# Patient Record
Sex: Female | Born: 1980
Health system: Southern US, Community
[De-identification: ages and names within clinical notes are randomized; demographics above are authoritative.]

## PROBLEM LIST (undated history)

## (undated) DIAGNOSIS — D649 Anemia, unspecified: Secondary | ICD-10-CM

## (undated) DIAGNOSIS — F32A Depression, unspecified: Secondary | ICD-10-CM

## (undated) DIAGNOSIS — F419 Anxiety disorder, unspecified: Secondary | ICD-10-CM

## (undated) DIAGNOSIS — F329 Major depressive disorder, single episode, unspecified: Secondary | ICD-10-CM

## (undated) DIAGNOSIS — Z8659 Personal history of other mental and behavioral disorders: Secondary | ICD-10-CM

## (undated) DIAGNOSIS — I10 Essential (primary) hypertension: Secondary | ICD-10-CM

## (undated) DIAGNOSIS — O159 Eclampsia, unspecified as to time period: Secondary | ICD-10-CM

## (undated) DIAGNOSIS — I959 Hypotension, unspecified: Secondary | ICD-10-CM

## (undated) DIAGNOSIS — R32 Unspecified urinary incontinence: Secondary | ICD-10-CM

## (undated) HISTORY — DX: Hypotension, unspecified: I95.9

## (undated) HISTORY — DX: Unspecified urinary incontinence: R32

## (undated) HISTORY — PX: GALLBLADDER SURGERY: SHX652

## (undated) HISTORY — PX: WISDOM TOOTH EXTRACTION: SHX21

## (undated) HISTORY — DX: Anemia, unspecified: D64.9

## (undated) HISTORY — PX: MOUTH SURGERY: SHX715

## (undated) HISTORY — DX: Personal history of other mental and behavioral disorders: Z86.59

---

## 2013-08-12 DIAGNOSIS — O159 Eclampsia, unspecified as to time period: Secondary | ICD-10-CM

## 2013-08-17 DIAGNOSIS — Z8759 Personal history of other complications of pregnancy, childbirth and the puerperium: Secondary | ICD-10-CM | POA: Insufficient documentation

## 2018-11-24 NOTE — L&D Delivery Note (Signed)
OB/GYN Faculty Practice Delivery Note  Mary Hardy is a 38 y.o. [id number] s/p SVD at [redacted]w[redacted]d. She was admitted for IOL secondary to pre-E without severe features superimposed on cHTN. Patient also with hx of A2DM.  ROM: 10h 7m with clear fluid GBS Status: pos   Maximum Maternal Temperature: 99.46F  Labor Progress: Patient presented to L&D for IOL secondary to pre-E without severe features superimposed on cHTN. Patient also with hx of A2DM. Initial SVE: 2/thick/-3. She received Cytotec and Foley. She received an Epidural. Patient received adequate GBS ppx with PCN. She then progressed to complete.   Delivery Date/Time: 8/22 at 1126 Delivery: Called to room and patient was complete and pushing. Head delivered in ROA position. No nuchal cord present. Shoulder and body delivered in usual fashion. Infant with spontaneous cry, placed on mother's abdomen, dried and stimulated. Cord clamped x 2 after 1-minute delay, and cut by provider. Cord blood drawn. Placenta delivered spontaneously with gentle cord traction. Fundus firm with massage and Pitocin. Labia, perineum, vagina, and cervix inspected inspected with no lacerations.  Baby Weight: 3121 g  Placenta: sent to L&D Complications: None Lacerations: None EBL: 400 mL Analgesia: Epidural    Postpartum Planning [ ]  message to sent to schedule follow-up  [ ]  vaccines UTD  Infant: APGAR (1 MIN): 8  APGAR (5 MINS):  9 APGAR (10 MINS):     Barrington Ellison, MD [facility] Family Medicine Fellow, [facility], [place] [facility] 07/16/2019, 1:37 PM

## 2018-12-08 ENCOUNTER — Encounter: Payer: BLUE CROSS/BLUE SHIELD | Admitting: Adult Health

## 2018-12-09 ENCOUNTER — Encounter: Payer: Self-pay | Admitting: Adult Health

## 2018-12-09 ENCOUNTER — Ambulatory Visit: Payer: BLUE CROSS/BLUE SHIELD | Admitting: Adult Health

## 2018-12-09 VITALS — BP 123/93 | HR 97 | Ht 62.0 in | Wt 190.5 lb

## 2018-12-09 DIAGNOSIS — Z3201 Encounter for pregnancy test, result positive: Secondary | ICD-10-CM | POA: Diagnosis not present

## 2018-12-09 DIAGNOSIS — O09521 Supervision of elderly multigravida, first trimester: Secondary | ICD-10-CM

## 2018-12-09 DIAGNOSIS — O09211 Supervision of pregnancy with history of pre-term labor, first trimester: Secondary | ICD-10-CM

## 2018-12-09 DIAGNOSIS — R110 Nausea: Secondary | ICD-10-CM

## 2018-12-09 DIAGNOSIS — N926 Irregular menstruation, unspecified: Secondary | ICD-10-CM

## 2018-12-09 DIAGNOSIS — O3680X0 Pregnancy with inconclusive fetal viability, not applicable or unspecified: Secondary | ICD-10-CM | POA: Insufficient documentation

## 2018-12-09 DIAGNOSIS — Z3490 Encounter for supervision of normal pregnancy, unspecified, unspecified trimester: Secondary | ICD-10-CM

## 2018-12-09 DIAGNOSIS — Z8751 Personal history of pre-term labor: Secondary | ICD-10-CM | POA: Insufficient documentation

## 2018-12-09 DIAGNOSIS — O09891 Supervision of other high risk pregnancies, first trimester: Secondary | ICD-10-CM

## 2018-12-09 LAB — POCT URINE PREGNANCY: Preg Test, Ur: POSITIVE — AB

## 2018-12-09 MED ORDER — PRENATAL PLUS 27-1 MG PO TABS: 1.0000 | ORAL_TABLET | Freq: Every day | ORAL | 12 refills | Status: DC

## 2018-12-09 MED ORDER — ESCITALOPRAM OXALATE 10 MG PO TABS: 10.0000 mg | ORAL_TABLET | Freq: Every day | ORAL | 6 refills | Status: DC

## 2018-12-09 NOTE — Patient Instructions (Signed)
First Trimester of Pregnancy  The first trimester of pregnancy is from week 1 until the end of week 13 (months 1 through 3). A week after a sperm fertilizes an egg, the egg will implant on the wall of the uterus. This embryo will begin to develop into a baby. Genes from you and your partner will form the baby. The female genes will determine whether the baby will be a boy or a girl. At 6-8 weeks, the eyes and face will be formed, and the heartbeat can be seen on ultrasound. At the end of 12 weeks, all the baby's organs will be formed.  Now that you are pregnant, you will want to do everything you can to have a healthy baby. Two of the most important things are to get good prenatal care and to follow your health care provider's instructions. Prenatal care is all the medical care you receive before the baby's birth. This care will help prevent, find, and treat any problems during the pregnancy and childbirth.  Body changes during your first trimester  Your body goes through many changes during pregnancy. The changes vary from woman to woman.   You may gain or lose a couple of pounds at first.   You may feel sick to your stomach (nauseous) and you may throw up (vomit). If the vomiting is uncontrollable, call your health care provider.   You may tire easily.   You may develop headaches that can be relieved by medicines. All medicines should be approved by your health care provider.   You may urinate more often. Painful urination may mean you have a bladder infection.   You may develop heartburn as a result of your pregnancy.   You may develop constipation because certain hormones are causing the muscles that push stool through your intestines to slow down.   You may develop hemorrhoids or swollen veins (varicose veins).   Your breasts may begin to grow larger and become tender. Your nipples may stick out more, and the tissue that surrounds them (areola) may become darker.   Your gums may bleed and may be  sensitive to brushing and flossing.   Dark spots or blotches (chloasma, mask of pregnancy) may develop on your face. This will likely fade after the baby is born.   Your menstrual periods will stop.   You may have a loss of appetite.   You may develop cravings for certain kinds of food.   You may have changes in your emotions from day to day, such as being excited to be pregnant or being concerned that something may go wrong with the pregnancy and baby.   You may have more vivid and strange dreams.   You may have changes in your hair. These can include thickening of your hair, rapid growth, and changes in texture. Some women also have hair loss during or after pregnancy, or hair that feels dry or thin. Your hair will most likely return to normal after your baby is born.  What to expect at prenatal visits  During a routine prenatal visit:   You will be weighed to make sure you and the baby are growing normally.   Your blood pressure will be taken.   Your abdomen will be measured to track your baby's growth.   The fetal heartbeat will be listened to between weeks 10 and 14 of your pregnancy.   Test results from any previous visits will be discussed.  Your health care provider may ask you:     How you are feeling.   If you are feeling the baby move.   If you have had any abnormal symptoms, such as leaking fluid, bleeding, severe headaches, or abdominal cramping.   If you are using any tobacco products, including cigarettes, chewing tobacco, and electronic cigarettes.   If you have any questions.  Other tests that may be performed during your first trimester include:   Blood tests to find your blood type and to check for the presence of any previous infections. The tests will also be used to check for low iron levels (anemia) and protein on red blood cells (Rh antibodies). Depending on your risk factors, or if you previously had diabetes during pregnancy, you may have tests to check for high blood sugar  that affects pregnant women (gestational diabetes).   Urine tests to check for infections, diabetes, or protein in the urine.   An ultrasound to confirm the proper growth and development of the baby.   Fetal screens for spinal cord problems (spina bifida) and Down syndrome.   HIV (human immunodeficiency virus) testing. Routine prenatal testing includes screening for HIV, unless you choose not to have this test.   You may need other tests to make sure you and the baby are doing well.  Follow these instructions at home:  Medicines   Follow your health care provider's instructions regarding medicine use. Specific medicines may be either safe or unsafe to take during pregnancy.   Take a prenatal vitamin that contains at least 600 micrograms (mcg) of folic acid.   If you develop constipation, try taking a stool softener if your health care provider approves.  Eating and drinking     Eat a balanced diet that includes fresh fruits and vegetables, whole grains, good sources of protein such as meat, eggs, or tofu, and low-fat dairy. Your health care provider will help you determine the amount of weight gain that is right for you.   Avoid raw meat and uncooked cheese. These carry germs that can cause birth defects in the baby.   Eating four or five small meals rather than three large meals a day may help relieve nausea and vomiting. If you start to feel nauseous, eating a few soda crackers can be helpful. Drinking liquids between meals, instead of during meals, also seems to help ease nausea and vomiting.   Limit foods that are high in fat and processed sugars, such as fried and sweet foods.   To prevent constipation:  ? Eat foods that are high in fiber, such as fresh fruits and vegetables, whole grains, and beans.  ? Drink enough fluid to keep your urine clear or pale yellow.  Activity   Exercise only as directed by your health care provider. Most women can continue their usual exercise routine during  pregnancy. Try to exercise for 30 minutes at least 5 days a week. Exercising will help you:  ? Control your weight.  ? Stay in shape.  ? Be prepared for labor and delivery.   Experiencing pain or cramping in the lower abdomen or lower back is a good sign that you should stop exercising. Check with your health care provider before continuing with normal exercises.   Try to avoid standing for long periods of time. Move your legs often if you must stand in one place for a long time.   Avoid heavy lifting.   Wear low-heeled shoes and practice good posture.   You may continue to have sex unless your health care   provider tells you not to.  Relieving pain and discomfort   Wear a good support bra to relieve breast tenderness.   Take warm sitz baths to soothe any pain or discomfort caused by hemorrhoids. Use hemorrhoid cream if your health care provider approves.   Rest with your legs elevated if you have leg cramps or low back pain.   If you develop varicose veins in your legs, wear support hose. Elevate your feet for 15 minutes, 3-4 times a day. Limit salt in your diet.  Prenatal care   Schedule your prenatal visits by the twelfth week of pregnancy. They are usually scheduled monthly at first, then more often in the last 2 months before delivery.   Write down your questions. Take them to your prenatal visits.   Keep all your prenatal visits as told by your health care provider. This is important.  Safety   Wear your seat belt at all times when driving.   Make a list of emergency phone numbers, including numbers for family, friends, the hospital, and police and fire departments.  General instructions   Ask your health care provider for a referral to a local prenatal education class. Begin classes no later than the beginning of month 6 of your pregnancy.   Ask for help if you have counseling or nutritional needs during pregnancy. Your health care provider can offer advice or refer you to specialists for help  with various needs.   Do not use hot tubs, steam rooms, or saunas.   Do not douche or use tampons or scented sanitary pads.   Do not cross your legs for long periods of time.   Avoid cat litter boxes and soil used by cats. These carry germs that can cause birth defects in the baby and possibly loss of the fetus by miscarriage or stillbirth.   Avoid all smoking, herbs, alcohol, and medicines not prescribed by your health care provider. Chemicals in these products affect the formation and growth of the baby.   Do not use any products that contain nicotine or tobacco, such as cigarettes and e-cigarettes. If you need help quitting, ask your health care provider. You may receive counseling support and other resources to help you quit.   Schedule a dentist appointment. At home, brush your teeth with a soft toothbrush and be gentle when you floss.  Contact a health care provider if:   You have dizziness.   You have mild pelvic cramps, pelvic pressure, or nagging pain in the abdominal area.   You have persistent nausea, vomiting, or diarrhea.   You have a bad smelling vaginal discharge.   You have pain when you urinate.   You notice increased swelling in your face, hands, legs, or ankles.   You are exposed to fifth disease or chickenpox.   You are exposed to German measles (rubella) and have never had it.  Get help right away if:   You have a fever.   You are leaking fluid from your vagina.   You have spotting or bleeding from your vagina.   You have severe abdominal cramping or pain.   You have rapid weight gain or loss.   You vomit blood or material that looks like coffee grounds.   You develop a severe headache.   You have shortness of breath.   You have any kind of trauma, such as from a fall or a car accident.  Summary   The first trimester of pregnancy is from week 1 until   the end of week 13 (months 1 through 3).   Your body goes through many changes during pregnancy. The changes vary from  woman to woman.   You will have routine prenatal visits. During those visits, your health care provider will examine you, discuss any test results you may have, and talk with you about how you are feeling.  This information is not intended to replace advice given to you by your health care provider. Make sure you discuss any questions you have with your health care provider.  Document Released: 11/04/2001 Document Revised: 10/22/2016 Document Reviewed: 10/22/2016  Elsevier Interactive Patient Education  2019 [employer]

## 2018-12-09 NOTE — Progress Notes (Signed)
Patient ID: Mary Hardy, female   DOB: 02-May-1981, 38 y.o.   MRN: [id number] History of Present Illness: Shawnte is a 38 year old white female, married, in for UPT, has missed periods, but had ben on OCs till 2 weeks before Thanksgiving. She has 25 year old daughter, that was delivered at 1 + weeks.   Current Medications, Allergies, Past Medical History, Past Surgical History, Family History and Social History were reviewed in [employer] record.     Review of Systems: +missed periods, with 5 +HPTs +nausea  +UI, ?due to nerve damage at delivery she says     Physical Exam:BP (!) 123/93 (BP Location: Left Arm, Patient Position: Sitting, Cuff Size: Normal)   Pulse 97   Ht 5\' 2"  (1.575 m)   Wt 190 lb 8 oz (86.4 kg)   LMP 08/03/2018 (Approximate)   BMI 34.84 kg/m   UPT+, 18+2 weeks by LMP with EDD 05/11/2019 General:  Well developed, well nourished, no acute distress Skin:  Warm and dry Neck:  Midline trachea, normal thyroid, good ROM, no lymphadenopathy Lungs; Clear to auscultation bilaterally Cardiovascular: Regular rate and rhythm Abdomen:  Soft, non tender,could not hear FHT with doppler.but not 18 weeks by size Psych:  No mood changes, alert and cooperative,seems happy Fall risk is low.  PHQ 9 score is 16, denies being suicidal, had postpartum depression(did not take meds then), will rx lexapro. Face time 20 minutes.  Impression: 1. Pregnancy examination or test, positive result   2. Pregnancy, unspecified gestational age   36. Encounter to determine fetal viability of pregnancy, single or unspecified fetus   4. Multigravida of advanced maternal age in first trimester   5. History of preterm delivery, currently pregnant in first trimester       Plan: Meds ordered this encounter  Medications  . prenatal vitamin w/FE, FA (PRENATAL 1 + 1) 27-1 MG TABS tablet    Sig: Take 1 tablet by mouth daily at 12 noon.    Dispense:  30 each    Refill:  12   Order Specific Question:   Supervising Provider    Answer:   Despina Hidden, LUTHER H [[id number]]  . escitalopram (LEXAPRO) 10 MG tablet    Sig: Take 1 tablet (10 mg total) by mouth daily.    Dispense:  30 tablet    Refill:  6    Order Specific Question:   Supervising Provider    Answer:   Lazaro Arms [[id number]]  Eat often Request records from [facility] care delivery and from doctor see saw about UI Return in 1 week for dating US/ 2 weeks for new OB Review handouts on First trimester and by [facility]

## 2018-12-17 ENCOUNTER — Ambulatory Visit (INDEPENDENT_AMBULATORY_CARE_PROVIDER_SITE_OTHER): Payer: BLUE CROSS/BLUE SHIELD

## 2018-12-17 DIAGNOSIS — O3680X0 Pregnancy with inconclusive fetal viability, not applicable or unspecified: Secondary | ICD-10-CM | POA: Diagnosis not present

## 2018-12-17 NOTE — Progress Notes (Signed)
[place] 7+3 wks,single IUP w/ys,positive fht 153 bpm,normal left ovary,simple right ovarian cyst 4.6 x 3.5 x 3.4 cm,crl 12.22 mm

## 2018-12-28 ENCOUNTER — Encounter: Payer: Self-pay | Admitting: *Deleted

## 2018-12-29 ENCOUNTER — Telehealth: Payer: Self-pay | Admitting: Women's Health

## 2018-12-29 NOTE — Telephone Encounter (Signed)
Pt husband called back and said his wife if feeling better and does not need a nurse to call

## 2018-12-29 NOTE — Telephone Encounter (Signed)
PT'S HUSBAND CALLED AND NEEDS TO TALK TO A NURSE ABOUT PAIN SHE IS HAVING. IS ONLY 10 WEEKS. PT DOES NOT HAVE ANY BLEEDING

## 2019-01-03 ENCOUNTER — Other Ambulatory Visit: Payer: Self-pay

## 2019-01-03 ENCOUNTER — Other Ambulatory Visit (HOSPITAL_COMMUNITY)
Admission: RE | Admit: 2019-01-03 | Discharge: 2019-01-03 | Disposition: A | Discharge status: Home / Self Care | Payer: BLUE CROSS/BLUE SHIELD | Source: Ambulatory Visit | Attending: Obstetrics & Gynecology | Admitting: Obstetrics & Gynecology

## 2019-01-03 ENCOUNTER — Ambulatory Visit: Payer: BLUE CROSS/BLUE SHIELD | Admitting: *Deleted

## 2019-01-03 ENCOUNTER — Ambulatory Visit (INDEPENDENT_AMBULATORY_CARE_PROVIDER_SITE_OTHER): Payer: BLUE CROSS/BLUE SHIELD | Admitting: Women's Health

## 2019-01-03 ENCOUNTER — Encounter: Payer: Self-pay | Admitting: Women's Health

## 2019-01-03 VITALS — BP 134/91 | HR 93 | Wt 194.0 lb

## 2019-01-03 DIAGNOSIS — O09291 Supervision of pregnancy with other poor reproductive or obstetric history, first trimester: Secondary | ICD-10-CM | POA: Diagnosis not present

## 2019-01-03 DIAGNOSIS — Z3482 Encounter for supervision of other normal pregnancy, second trimester: Secondary | ICD-10-CM | POA: Diagnosis not present

## 2019-01-03 DIAGNOSIS — Z3A09 9 weeks gestation of pregnancy: Secondary | ICD-10-CM | POA: Diagnosis not present

## 2019-01-03 DIAGNOSIS — Z124 Encounter for screening for malignant neoplasm of cervix: Secondary | ICD-10-CM | POA: Insufficient documentation

## 2019-01-03 DIAGNOSIS — O09299 Supervision of pregnancy with other poor reproductive or obstetric history, unspecified trimester: Secondary | ICD-10-CM | POA: Insufficient documentation

## 2019-01-03 DIAGNOSIS — F418 Other specified anxiety disorders: Secondary | ICD-10-CM

## 2019-01-03 DIAGNOSIS — Z23 Encounter for immunization: Secondary | ICD-10-CM | POA: Diagnosis not present

## 2019-01-03 DIAGNOSIS — Z3682 Encounter for antenatal screening for nuchal translucency: Secondary | ICD-10-CM

## 2019-01-03 DIAGNOSIS — Z8759 Personal history of other complications of pregnancy, childbirth and the puerperium: Secondary | ICD-10-CM

## 2019-01-03 DIAGNOSIS — N393 Stress incontinence (female) (male): Secondary | ICD-10-CM

## 2019-01-03 DIAGNOSIS — Z1389 Encounter for screening for other disorder: Secondary | ICD-10-CM

## 2019-01-03 DIAGNOSIS — Z8751 Personal history of pre-term labor: Secondary | ICD-10-CM

## 2019-01-03 DIAGNOSIS — O0990 Supervision of high risk pregnancy, unspecified, unspecified trimester: Secondary | ICD-10-CM | POA: Insufficient documentation

## 2019-01-03 DIAGNOSIS — Z331 Pregnant state, incidental: Secondary | ICD-10-CM

## 2019-01-03 MED ORDER — CITRANATAL ASSURE 35-1 & 300 MG PO MISC: ORAL | 11 refills | Status: DC

## 2019-01-03 NOTE — Patient Instructions (Addendum)
Mary Hardy, I greatly value your feedback.  If you receive a survey following your visit with [place] today, we appreciate you taking the time to fill it out.  Thanks, Joellyn Haff, CNM, WHNP-BC  Begin taking 162mg  (two 81mg  tablets) baby aspirin daily at 12 weeks of pregnancy (2/25)  to decrease risk of preeclampsia during pregnancy    Check your blood pressure 4 times daily and keep a log, bring this with you to all appointments.  If blood pressure is >=160 on top or >=110 on bottom, check again in 5 minutes, if still this high, call us    Nausea & Vomiting  Have saltine crackers or pretzels by your bed and eat a few bites before you raise your head out of bed in the morning  Eat small frequent meals throughout the day instead of large meals  Drink plenty of fluids throughout the day to stay hydrated, just don't drink a lot of fluids with your meals.  This can make your stomach fill up faster making you feel sick  Do not brush your teeth right after you eat  Products with real ginger are good for nausea, like ginger ale and ginger hard candy Make sure it says made with real ginger!  Sucking on sour candy like lemon heads is also good for nausea  If your prenatal vitamins make you nauseated, take them at night so you will sleep through the nausea  Sea Bands  If you feel like you need medicine for the nausea & vomiting please let us know  If you are unable to keep any fluids or food down please let us know   Constipation  Drink plenty of fluid, preferably water, throughout the day  Eat foods high in fiber such as fruits, vegetables, and grains  Exercise, such as walking, is a good way to keep your bowels regular  Drink warm fluids, especially warm prune juice, or decaf coffee  Eat a 1/2 cup of real oatmeal (not instant), 1/2 cup applesauce, and 1/2-1 cup warm prune juice every day  If needed, you may take Colace (docusate sodium) stool softener once or twice a day to help keep  the stool soft. If you are pregnant, wait until you are out of your first trimester (12-14 weeks of pregnancy)  If you still are having problems with constipation, you may take Miralax once daily as needed to help keep your bowels regular.  If you are pregnant, wait until you are out of your first trimester (12-14 weeks of pregnancy)   First Trimester of Pregnancy The first trimester of pregnancy is from week 1 until the end of week 12 (months 1 through 3). A week after a sperm fertilizes an egg, the egg will implant on the wall of the uterus. This embryo will begin to develop into a baby. Genes from you and your partner are forming the baby. The female genes determine whether the baby is a boy or a girl. At 6-8 weeks, the eyes and face are formed, and the heartbeat can be seen on ultrasound. At the end of 12 weeks, all the baby's organs are formed.  Now that you are pregnant, you will want to do everything you can to have a healthy baby. Two of the most important things are to get good prenatal care and to follow your health care provider's instructions. Prenatal care is all the medical care you receive before the baby's birth. This care will help prevent, find, and treat any problems  during the pregnancy and childbirth. BODY CHANGES Your body goes through many changes during pregnancy. The changes vary from woman to woman.   You may gain or lose a couple of pounds at first.  You may feel sick to your stomach (nauseous) and throw up (vomit). If the vomiting is uncontrollable, call your health care provider.  You may tire easily.  You may develop headaches that can be relieved by medicines approved by your health care provider.  You may urinate more often. Painful urination may mean you have a bladder infection.  You may develop heartburn as a result of your pregnancy.  You may develop constipation because certain hormones are causing the muscles that push waste through your intestines to slow  down.  You may develop hemorrhoids or swollen, bulging veins (varicose veins).  Your breasts may begin to grow larger and become tender. Your nipples may stick out more, and the tissue that surrounds them (areola) may become darker.  Your gums may bleed and may be sensitive to brushing and flossing.  Dark spots or blotches (chloasma, mask of pregnancy) may develop on your face. This will likely fade after the baby is born.  Your menstrual periods will stop.  You may have a loss of appetite.  You may develop cravings for certain kinds of food.  You may have changes in your emotions from day to day, such as being excited to be pregnant or being concerned that something may go wrong with the pregnancy and baby.  You may have more vivid and strange dreams.  You may have changes in your hair. These can include thickening of your hair, rapid growth, and changes in texture. Some women also have hair loss during or after pregnancy, or hair that feels dry or thin. Your hair will most likely return to normal after your baby is born. WHAT TO EXPECT AT YOUR PRENATAL VISITS During a routine prenatal visit:  You will be weighed to make sure you and the baby are growing normally.  Your blood pressure will be taken.  Your abdomen will be measured to track your baby's growth.  The fetal heartbeat will be listened to starting around week 10 or 12 of your pregnancy.  Test results from any previous visits will be discussed. Your health care provider may ask you:  How you are feeling.  If you are feeling the baby move.  If you have had any abnormal symptoms, such as leaking fluid, bleeding, severe headaches, or abdominal cramping.  If you have any questions. Other tests that may be performed during your first trimester include:  Blood tests to find your blood type and to check for the presence of any previous infections. They will also be used to check for low iron levels (anemia) and Rh  antibodies. Later in the pregnancy, blood tests for diabetes will be done along with other tests if problems develop.  Urine tests to check for infections, diabetes, or protein in the urine.  An ultrasound to confirm the proper growth and development of the baby.  An amniocentesis to check for possible genetic problems.  Fetal screens for spina bifida and Down syndrome.  You may need other tests to make sure you and the baby are doing well. HOME CARE INSTRUCTIONS  Medicines  Follow your health care provider's instructions regarding medicine use. Specific medicines may be either safe or unsafe to take during pregnancy.  Take your prenatal vitamins as directed.  If you develop constipation, try taking a stool  softener if your health care provider approves. Diet  Eat regular, well-balanced meals. Choose a variety of foods, such as meat or vegetable-based protein, fish, milk and low-fat dairy products, vegetables, fruits, and whole grain breads and cereals. Your health care provider will help you determine the amount of weight gain that is right for you.  Avoid raw meat and uncooked cheese. These carry germs that can cause birth defects in the baby.  Eating four or five small meals rather than three large meals a day may help relieve nausea and vomiting. If you start to feel nauseous, eating a few soda crackers can be helpful. Drinking liquids between meals instead of during meals also seems to help nausea and vomiting.  If you develop constipation, eat more high-fiber foods, such as fresh vegetables or fruit and whole grains. Drink enough fluids to keep your urine clear or pale yellow. Activity and Exercise  Exercise only as directed by your health care provider. Exercising will help you:  Control your weight.  Stay in shape.  Be prepared for labor and delivery.  Experiencing pain or cramping in the lower abdomen or low back is a good sign that you should stop exercising. Check  with your health care provider before continuing normal exercises.  Try to avoid standing for long periods of time. Move your legs often if you must stand in one place for a long time.  Avoid heavy lifting.  Wear low-heeled shoes, and practice good posture.  You may continue to have sex unless your health care provider directs you otherwise. Relief of Pain or Discomfort  Wear a good support bra for breast tenderness.   Take warm sitz baths to soothe any pain or discomfort caused by hemorrhoids. Use hemorrhoid cream if your health care provider approves.   Rest with your legs elevated if you have leg cramps or low back pain.  If you develop varicose veins in your legs, wear support hose. Elevate your feet for 15 minutes, 3-4 times a day. Limit salt in your diet. Prenatal Care  Schedule your prenatal visits by the twelfth week of pregnancy. They are usually scheduled monthly at first, then more often in the last 2 months before delivery.  Write down your questions. Take them to your prenatal visits.  Keep all your prenatal visits as directed by your health care provider. Safety  Wear your seat belt at all times when driving.  Make a list of emergency phone numbers, including numbers for family, friends, the hospital, and police and fire departments. General Tips  Ask your health care provider for a referral to a local prenatal education class. Begin classes no later than at the beginning of month 6 of your pregnancy.  Ask for help if you have counseling or nutritional needs during pregnancy. Your health care provider can offer advice or refer you to specialists for help with various needs.  Do not use hot tubs, steam rooms, or saunas.  Do not douche or use tampons or scented sanitary pads.  Do not cross your legs for long periods of time.  Avoid cat litter boxes and soil used by cats. These carry germs that can cause birth defects in the baby and possibly loss of the fetus  by miscarriage or stillbirth.  Avoid all smoking, herbs, alcohol, and medicines not prescribed by your health care provider. Chemicals in these affect the formation and growth of the baby.  Schedule a dentist appointment. At home, brush your teeth with a soft toothbrush and  be gentle when you floss. SEEK MEDICAL CARE IF:   You have dizziness.  You have mild pelvic cramps, pelvic pressure, or nagging pain in the abdominal area.  You have persistent nausea, vomiting, or diarrhea.  You have a bad smelling vaginal discharge.  You have pain with urination.  You notice increased swelling in your face, hands, legs, or ankles. SEEK IMMEDIATE MEDICAL CARE IF:   You have a fever.  You are leaking fluid from your vagina.  You have spotting or bleeding from your vagina.  You have severe abdominal cramping or pain.  You have rapid weight gain or loss.  You vomit blood or material that looks like coffee grounds.  You are exposed to [place] measles and have never had them.  You are exposed to fifth disease or chickenpox.  You develop a severe headache.  You have shortness of breath.  You have any kind of trauma, such as from a fall or a car accident. Document Released: 11/04/2001 Document Revised: 03/27/2014 Document Reviewed: 09/20/2013 [facility] Patient Information 2015 [employer], [place]. This information is not intended to replace advice given to you by your health care provider. Make sure you discuss any questions you have with your health care provider.

## 2019-01-03 NOTE — Progress Notes (Signed)
INITIAL OBSTETRICAL VISIT Patient name: Mary Hardy MRN [id number]  Date of birth: [birth year] Chief Complaint:   Initial Prenatal Visit (anxeity from last pregnancy, needs PNV script)  History of Present Illness:   Mary Hardy is a 38 y.o. [phone] Caucasian female at [redacted]w[redacted]d by 7wk u/s, with an Estimated Date of Delivery: 08/02/19 being seen today for her initial obstetrical visit.   Her obstetrical history is significant for SAB x 2, h/o 34wk PTB d/t eclampsia, then readmitted 4d pp for severe pre-e for 4 days.  Was on bp meds x 42yr, then stopped. NO h/o HTN since. 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 Birth was non-operative. Dep/anx- on Lexapro.  Today she reports no complaints.  Patient's last menstrual period was 08/03/2018 (approximate). Last pap >6yrs ago. Results were: normal Review of Systems:   Pertinent items are noted in HPI Denies cramping/contractions, leakage of fluid, vaginal bleeding, abnormal vaginal discharge w/ itching/odor/irritation, headaches, visual changes, shortness of breath, chest pain, abdominal pain, severe nausea/vomiting, or problems with urination or bowel movements unless otherwise stated above.  Pertinent History Reviewed:  Reviewed past medical,surgical, social, obstetrical and family history.  Reviewed problem list, medications and allergies. OB History  Gravida Para Term Preterm AB Living  4 1   1 2 1   SAB TAB Ectopic Multiple Live Births  2       1    # Outcome Date GA Lbr Len/2nd Weight Sex Delivery Anes PTL Lv  4 Current           3 SAB 2017          2 Preterm 08/12/13 [redacted]w[redacted]d  4 lb 15.7 oz (2.26 kg) F Vag-Spont EPI  LIV     Complications: Eclampsia  1 SAB            Physical Assessment:   Vitals:   01/03/19 1359 01/03/19 1419  BP: (!) 139/97 (!) 134/91  Pulse: 93   Weight: 194 lb (88 kg)   Body mass index is 35.48 kg/m.       Physical  Examination:  General appearance - well appearing, and in no distress  Mental status - alert, oriented to person, place, and time  Psych:  She has a normal mood and affect  Skin - warm and dry, normal color, no suspicious lesions noted  Chest - effort normal, all lung fields clear to auscultation bilaterally  Heart - normal rate and regular rhythm  Abdomen - soft, nontender  Extremities:  No swelling or varicosities noted  Pelvic - VULVA: normal appearing vulva with no masses, tenderness or lesions  VAGINA: normal appearing vagina with normal color and discharge, no lesions  CERVIX: normal appearing cervix without discharge or lesions, no CMT  Thin prep pap is done w/ HR HPV cotesting  Fetal Heart Rate (bpm): +u/s via informal transabdominal u/s, +active fetus   Assessment & Plan:  1) Low-Risk Pregnancy [id number] at [redacted]w[redacted]d with an Estimated Date of Delivery: 08/02/19   2) Initial OB visit  3) H/O eclampsia and pp pre-e> baseline labs today, ASA 162mg  @ 12wks  4) Elevated bp> denies h/o HTN outside of pregnancies, check bp QID at home, bring to appt/send mychart message  5) Dep/anx> continue Lexapro  6) 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 Prenat w/o [id number] (CITRANATAL ASSURE) 35-1 &  300 MG tablet    Sig: One tablet and one capsule daily    Dispense:  60 tablet    Refill:  11    Order Specific Question:   Supervising Provider    Answer:   Duane LopeEURE, LUTHER H [[id number]]    Initial labs obtained Continue prenatal vitamins Reviewed n/v relief measures and warning s/s to report Reviewed recommended weight gain based on pre-gravid BMI Encouraged well-balanced diet Genetic Screening discussed Integrated Screen: requested Cystic fibrosis screening discussed declined Ultrasound discussed; fetal survey: requested CCNC completed Flu shot today  Follow-up: Return in about 22 days (around 01/25/2019)  for LROB, US:NT+1stIT.   Orders Placed This Encounter  Procedures  . Urine Culture  . US Fetal Nuchal Translucency Measurement  . Flu Vaccine QUAD 36+ mos IM  . Obstetric Panel, Including HIV  . Sickle cell screen  . Urinalysis, Routine w reflex microscopic  . Pain Management Screening Profile (10S)  . Comprehensive metabolic panel  . Protein / creatinine ratio, urine  . POC Urinalysis Dipstick OB    Cheral MarkerKimberly R Quante Pettry CNM, [facility] CenterWHNP-BC 01/03/2019

## 2019-01-04 ENCOUNTER — Encounter: Payer: Self-pay | Admitting: Women's Health

## 2019-01-04 DIAGNOSIS — F418 Other specified anxiety disorders: Secondary | ICD-10-CM | POA: Insufficient documentation

## 2019-01-04 DIAGNOSIS — F1290 Cannabis use, unspecified, uncomplicated: Secondary | ICD-10-CM | POA: Insufficient documentation

## 2019-01-04 LAB — OBSTETRIC PANEL, INCLUDING HIV
Antibody Screen: NEGATIVE
Basophils Absolute: 0.1 10*3/uL (ref 0.0–0.2)
Basos: 1 %
EOS (ABSOLUTE): 0.1 10*3/uL (ref 0.0–0.4)
Eos: 1 %
HEP B S AG: NEGATIVE
HIV Screen 4th Generation wRfx: NONREACTIVE
Hematocrit: 39.8 % (ref 34.0–46.6)
Hemoglobin: 13.8 g/dL (ref 11.1–15.9)
Immature Grans (Abs): 0.1 10*3/uL (ref 0.0–0.1)
Immature Granulocytes: 1 %
Lymphocytes Absolute: 1.8 10*3/uL (ref 0.7–3.1)
Lymphs: 15 %
MCH: 30.7 pg (ref 26.6–33.0)
MCHC: 34.7 g/dL (ref 31.5–35.7)
MCV: 89 fL (ref 79–97)
Monocytes Absolute: 0.6 10*3/uL (ref 0.1–0.9)
Monocytes: 5 %
Neutrophils Absolute: 9.2 10*3/uL — ABNORMAL HIGH (ref 1.4–7.0)
Neutrophils: 77 %
Platelets: 271 10*3/uL (ref 150–450)
RBC: 4.49 x10E6/uL (ref 3.77–5.28)
RDW: 12.8 % (ref 11.7–15.4)
RPR: NONREACTIVE
Rh Factor: POSITIVE
Rubella Antibodies, IGG: 11.4 index (ref >0.99)
WBC: 12 10*3/uL — ABNORMAL HIGH (ref 3.4–10.8)

## 2019-01-04 LAB — COMPREHENSIVE METABOLIC PANEL
ALBUMIN: 4 g/dL (ref 3.8–4.8)
ALT: 19 IU/L (ref 0–32)
AST: 18 IU/L (ref 0–40)
Albumin/Globulin Ratio: 1.7 (ref 1.2–2.2)
Alkaline Phosphatase: 66 IU/L (ref 39–117)
BUN / CREAT RATIO: 15 (ref 9–23)
BUN: 9 mg/dL (ref 6–20)
Bilirubin Total: <0.2 mg/dL (ref 0.0–1.2)
CO2: 20 mmol/L (ref 20–29)
Calcium: 8.9 mg/dL (ref 8.7–10.2)
Chloride: 102 mmol/L (ref 96–106)
Creatinine, Ser: 0.59 mg/dL (ref 0.57–1.00)
GFR calc Af Amer: 137 mL/min/{1.73_m2} (ref >59)
GFR calc non Af Amer: 119 mL/min/{1.73_m2} (ref >59)
Globulin, Total: 2.4 g/dL (ref 1.5–4.5)
Glucose: 95 mg/dL (ref 65–99)
Potassium: 4.6 mmol/L (ref 3.5–5.2)
Sodium: 136 mmol/L (ref 134–144)
Total Protein: 6.4 g/dL (ref 6.0–8.5)

## 2019-01-04 LAB — PROTEIN / CREATININE RATIO, URINE
Creatinine, Urine: 207.1 mg/dL
Protein, Ur: 23.1 mg/dL
Protein/Creat Ratio: 112 mg/g creat (ref 0–200)

## 2019-01-04 LAB — URINALYSIS, ROUTINE W REFLEX MICROSCOPIC
Bilirubin, UA: NEGATIVE
Glucose, UA: NEGATIVE
Ketones, UA: NEGATIVE
Leukocytes, UA: NEGATIVE
Nitrite, UA: NEGATIVE
RBC, UA: NEGATIVE
Specific Gravity, UA: >=1.03 — AB (ref 1.005–1.030)
Urobilinogen, Ur: 1 mg/dL (ref 0.2–1.0)
pH, UA: 6.5 (ref 5.0–7.5)

## 2019-01-04 LAB — PMP SCREEN PROFILE (10S), URINE
Amphetamine Scrn, Ur: NEGATIVE ng/mL
BARBITURATE SCREEN URINE: NEGATIVE ng/mL
BENZODIAZEPINE SCREEN, URINE: NEGATIVE ng/mL
CANNABINOIDS UR QL SCN: POSITIVE ng/mL — AB
Cocaine (Metab) Scrn, Ur: NEGATIVE ng/mL
Creatinine(Crt), U: 200.5 mg/dL (ref 20.0–300.0)
Methadone Screen, Urine: NEGATIVE ng/mL
OPIATE SCREEN URINE: NEGATIVE ng/mL
OXYCODONE+OXYMORPHONE UR QL SCN: NEGATIVE ng/mL
Ph of Urine: 6.2 (ref 4.5–8.9)
Phencyclidine Qn, Ur: NEGATIVE ng/mL
Propoxyphene Scrn, Ur: NEGATIVE ng/mL

## 2019-01-04 LAB — SICKLE CELL SCREEN: Sickle Cell Screen: NEGATIVE

## 2019-01-05 LAB — URINE CULTURE: ORGANISM ID, BACTERIA: NO GROWTH

## 2019-01-06 LAB — GC/CHLAMYDIA PROBE AMP
Chlamydia trachomatis, NAA: NEGATIVE
Neisseria gonorrhoeae by PCR: NEGATIVE

## 2019-01-06 LAB — CYTOLOGY - PAP
Chlamydia: NEGATIVE
Diagnosis: NEGATIVE
HPV (WINDOPATH): NOT DETECTED
Neisseria Gonorrhea: NEGATIVE

## 2019-01-10 ENCOUNTER — Other Ambulatory Visit: Payer: Self-pay | Admitting: Women's Health

## 2019-01-10 MED ORDER — PRENATAL PLUS 27-1 MG PO TABS: 1.0000 | ORAL_TABLET | Freq: Every day | ORAL | 11 refills | Status: DC

## 2019-01-25 ENCOUNTER — Encounter: Payer: Self-pay | Admitting: Women's Health

## 2019-01-25 ENCOUNTER — Ambulatory Visit (INDEPENDENT_AMBULATORY_CARE_PROVIDER_SITE_OTHER): Payer: BLUE CROSS/BLUE SHIELD

## 2019-01-25 ENCOUNTER — Ambulatory Visit (INDEPENDENT_AMBULATORY_CARE_PROVIDER_SITE_OTHER): Payer: BLUE CROSS/BLUE SHIELD | Admitting: Women's Health

## 2019-01-25 VITALS — BP 121/90 | HR 91 | Wt 200.0 lb

## 2019-01-25 DIAGNOSIS — O10911 Unspecified pre-existing hypertension complicating pregnancy, first trimester: Secondary | ICD-10-CM

## 2019-01-25 DIAGNOSIS — Z3682 Encounter for antenatal screening for nuchal translucency: Secondary | ICD-10-CM

## 2019-01-25 DIAGNOSIS — I10 Essential (primary) hypertension: Secondary | ICD-10-CM

## 2019-01-25 DIAGNOSIS — Z3A13 13 weeks gestation of pregnancy: Secondary | ICD-10-CM

## 2019-01-25 DIAGNOSIS — O0991 Supervision of high risk pregnancy, unspecified, first trimester: Secondary | ICD-10-CM

## 2019-01-25 DIAGNOSIS — Z1389 Encounter for screening for other disorder: Secondary | ICD-10-CM

## 2019-01-25 DIAGNOSIS — Z1379 Encounter for other screening for genetic and chromosomal anomalies: Secondary | ICD-10-CM | POA: Diagnosis not present

## 2019-01-25 DIAGNOSIS — O0990 Supervision of high risk pregnancy, unspecified, unspecified trimester: Secondary | ICD-10-CM

## 2019-01-25 DIAGNOSIS — Z3482 Encounter for supervision of other normal pregnancy, second trimester: Secondary | ICD-10-CM

## 2019-01-25 DIAGNOSIS — O10919 Unspecified pre-existing hypertension complicating pregnancy, unspecified trimester: Secondary | ICD-10-CM

## 2019-01-25 DIAGNOSIS — Z331 Pregnant state, incidental: Secondary | ICD-10-CM

## 2019-01-25 LAB — POCT URINALYSIS DIPSTICK OB
Blood, UA: NEGATIVE
Glucose, UA: NEGATIVE
Ketones, UA: NEGATIVE
Leukocytes, UA: NEGATIVE
Nitrite, UA: NEGATIVE

## 2019-01-25 NOTE — Patient Instructions (Addendum)
Mary Hardy, I greatly value your feedback.  If you receive a survey following your visit with [place] today, we appreciate you taking the time to fill it out.  Thanks, Joellyn Haff, CNM, WHNP-BC   Second Trimester of Pregnancy The second trimester is from week 14 through week 27 (months 4 through 6). The second trimester is often a time when you feel your best. Your body has adjusted to being pregnant, and you begin to feel better physically. Usually, morning sickness has lessened or quit completely, you may have more energy, and you may have an increase in appetite. The second trimester is also a time when the fetus is growing rapidly. At the end of the sixth month, the fetus is about 9 inches long and weighs about 1 pounds. You will likely begin to feel the baby move (quickening) between 16 and 20 weeks of pregnancy. Body changes during your second trimester Your body continues to go through many changes during your second trimester. The changes vary from woman to woman.  Your weight will continue to increase. You will notice your lower abdomen bulging out.  You may begin to get stretch marks on your hips, abdomen, and breasts.  You may develop headaches that can be relieved by medicines. The medicines should be approved by your health care provider.  You may urinate more often because the fetus is pressing on your bladder.  You may develop or continue to have heartburn as a result of your pregnancy.  You may develop constipation because certain hormones are causing the muscles that push waste through your intestines to slow down.  You may develop hemorrhoids or swollen, bulging veins (varicose veins).  You may have back pain. This is caused by: ? Weight gain. ? Pregnancy hormones that are relaxing the joints in your pelvis. ? A shift in weight and the muscles that support your balance.  Your breasts will continue to grow and they will continue to become tender.  Your gums may bleed and  may be sensitive to brushing and flossing.  Dark spots or blotches (chloasma, mask of pregnancy) may develop on your face. This will likely fade after the baby is born.  A dark line from your belly button to the pubic area (linea nigra) may appear. This will likely fade after the baby is born.  You may have changes in your hair. These can include thickening of your hair, rapid growth, and changes in texture. Some women also have hair loss during or after pregnancy, or hair that feels dry or thin. Your hair will most likely return to normal after your baby is born.  What to expect at prenatal visits During a routine prenatal visit:  You will be weighed to make sure you and the fetus are growing normally.  Your blood pressure will be taken.  Your abdomen will be measured to track your baby's growth.  The fetal heartbeat will be listened to.  Any test results from the previous visit will be discussed.  Your health care provider may ask you:  How you are feeling.  If you are feeling the baby move.  If you have had any abnormal symptoms, such as leaking fluid, bleeding, severe headaches, or abdominal cramping.  If you are using any tobacco products, including cigarettes, chewing tobacco, and electronic cigarettes.  If you have any questions.  Other tests that may be performed during your second trimester include:  Blood tests that check for: ? Low iron levels (anemia). ? High blood  sugar that affects pregnant women (gestational diabetes) between 55 and 28 weeks. ? Rh antibodies. This is to check for a protein on red blood cells (Rh factor).  Urine tests to check for infections, diabetes, or protein in the urine.  An ultrasound to confirm the proper growth and development of the baby.  An amniocentesis to check for possible genetic problems.  Fetal screens for spina bifida and Down syndrome.  HIV (human immunodeficiency virus) testing. Routine prenatal testing includes  screening for HIV, unless you choose not to have this test.  Follow these instructions at home: Medicines  Follow your health care provider's instructions regarding medicine use. Specific medicines may be either safe or unsafe to take during pregnancy.  Take a prenatal vitamin that contains at least 600 micrograms (mcg) of folic acid.  If you develop constipation, try taking a stool softener if your health care provider approves. Eating and drinking  Eat a balanced diet that includes fresh fruits and vegetables, whole grains, good sources of protein such as meat, eggs, or tofu, and low-fat dairy. Your health care provider will help you determine the amount of weight gain that is right for you.  Avoid raw meat and uncooked cheese. These carry germs that can cause birth defects in the baby.  If you have low calcium intake from food, talk to your health care provider about whether you should take a daily calcium supplement.  Limit foods that are high in fat and processed sugars, such as fried and sweet foods.  To prevent constipation: ? Drink enough fluid to keep your urine clear or pale yellow. ? Eat foods that are high in fiber, such as fresh fruits and vegetables, whole grains, and beans. Activity  Exercise only as directed by your health care provider. Most women can continue their usual exercise routine during pregnancy. Try to exercise for 30 minutes at least 5 days a week. Stop exercising if you experience uterine contractions.  Avoid heavy lifting, wear low heel shoes, and practice good posture.  A sexual relationship may be continued unless your health care provider directs you otherwise. Relieving pain and discomfort  Wear a good support bra to prevent discomfort from breast tenderness.  Take warm sitz baths to soothe any pain or discomfort caused by hemorrhoids. Use hemorrhoid cream if your health care provider approves.  Rest with your legs elevated if you have leg cramps  or low back pain.  If you develop varicose veins, wear support hose. Elevate your feet for 15 minutes, 3-4 times a day. Limit salt in your diet. Prenatal Care  Write down your questions. Take them to your prenatal visits.  Keep all your prenatal visits as told by your health care provider. This is important. Safety  Wear your seat belt at all times when driving.  Make a list of emergency phone numbers, including numbers for family, friends, the hospital, and police and fire departments. General instructions  Ask your health care provider for a referral to a local prenatal education class. Begin classes no later than the beginning of month 6 of your pregnancy.  Ask for help if you have counseling or nutritional needs during pregnancy. Your health care provider can offer advice or refer you to specialists for help with various needs.  Do not use hot tubs, steam rooms, or saunas.  Do not douche or use tampons or scented sanitary pads.  Do not cross your legs for long periods of time.  Avoid cat litter boxes and soil used  by cats. These carry germs that can cause birth defects in the baby and possibly loss of the fetus by miscarriage or stillbirth.  Avoid all smoking, herbs, alcohol, and unprescribed drugs. Chemicals in these products can affect the formation and growth of the baby.  Do not use any products that contain nicotine or tobacco, such as cigarettes and e-cigarettes. If you need help quitting, ask your health care provider.  Visit your dentist if you have not gone yet during your pregnancy. Use a soft toothbrush to brush your teeth and be gentle when you floss. Contact a health care provider if:  You have dizziness.  You have mild pelvic cramps, pelvic pressure, or nagging pain in the abdominal area.  You have persistent nausea, vomiting, or diarrhea.  You have a bad smelling vaginal discharge.  You have pain when you urinate. Get help right away if:  You have a  fever.  You are leaking fluid from your vagina.  You have spotting or bleeding from your vagina.  You have severe abdominal cramping or pain.  You have rapid weight gain or weight loss.  You have shortness of breath with chest pain.  You notice sudden or extreme swelling of your face, hands, ankles, feet, or legs.  You have not felt your baby move in over an hour.  You have severe headaches that do not go away when you take medicine.  You have vision changes. Summary  The second trimester is from week 14 through week 27 (months 4 through 6). It is also a time when the fetus is growing rapidly.  Your body goes through many changes during pregnancy. The changes vary from woman to woman.  Avoid all smoking, herbs, alcohol, and unprescribed drugs. These chemicals affect the formation and growth your baby.  Do not use any tobacco products, such as cigarettes, chewing tobacco, and e-cigarettes. If you need help quitting, ask your health care provider.  Contact your health care provider if you have any questions. Keep all prenatal visits as told by your health care provider. This is important. This information is not intended to replace advice given to you by your health care provider. Make sure you discuss any questions you have with your health care provider. Document Released: 11/04/2001 Document Revised: 04/17/2016 Document Reviewed: 01/11/2013 Elsevier Interactive Patient Education  2017 [employer].

## 2019-01-25 NOTE — Progress Notes (Signed)
HIGH-RISK PREGNANCY VISIT Patient name: Mary Hardy MRN [id number]  Date of birth: 09-Oct-1981 Chief Complaint:   Routine Prenatal Visit (NT/1IT)  History of Present Illness:   Mary Hardy is a 38 y.o. [phone] female at [redacted]w[redacted]d with an Estimated Date of Delivery: 08/02/19 being seen today for ongoing management of a high-risk pregnancy complicated by [facility] dx today, h/o eclampsia w/ 34wk PTB and pp pre-e.  Today she reports multiple elevated home bp's, highest 147/105.  .  .  Movement: Absent. denies leaking of fluid.  Review of Systems:   Pertinent items are noted in HPI Denies abnormal vaginal discharge w/ itching/odor/irritation, headaches, visual changes, shortness of breath, chest pain, abdominal pain, severe nausea/vomiting, or problems with urination or bowel movements unless otherwise stated above. Pertinent History Reviewed:  Reviewed past medical,surgical, social, obstetrical and family history.  Reviewed problem list, medications and allergies. Physical Assessment:   Vitals:   01/25/19 1506  BP: 121/90  Pulse: 91  Weight: 200 lb (90.7 kg)  Body mass index is 36.58 kg/m.           Physical Examination:   General appearance: alert, well appearing, and in no distress  Mental status: alert, oriented to person, place, and time  Skin: warm & dry   Extremities: Edema: None    Cardiovascular: normal heart rate noted  Respiratory: normal respiratory effort, no distress  Abdomen: gravid, soft, non-tender  Pelvic: Cervical exam deferred         Fetal Status: Fetal Heart Rate (bpm): 150 u/s   Movement: Absent    Fetal Surveillance Testing today: [place] 13 wks,measurement c/w dates,CRL 68.38 mm,anterior circumvallate placenta gr 0,fhr 150 bpm,NB present,NT 1.5 mm,normal left ovary,simple right ovarian cyst 4 x 4.1 x 3.6 cm  Results for orders placed or performed in visit on 01/25/19 (from the past 24 hour(s))  POC Urinalysis Dipstick OB   Collection Time: 01/25/19  3:13 PM  Result  Value Ref Range   Color, UA     Clarity, UA     Glucose, UA Negative Negative   Bilirubin, UA     Ketones, UA neg    Spec Grav, UA     Blood, UA neg    pH, UA     POC,PROTEIN,UA Small (1+) Negative, Trace, Small (1+), Moderate (2+), Large (3+), 4+   Urobilinogen, UA     Nitrite, UA neg    Leukocytes, UA Negative Negative   Appearance     Odor      Assessment & Plan:  1) High-risk pregnancy [id number] at [redacted]w[redacted]d with an Estimated Date of Delivery: 08/02/19   2) CHTN, dx today, no meds, multiple elevated home bp's, continue to monitor, continue ASA 162mg , has already had baseline labs  3) H/O eclampsia w/ 34wk PTB then pp pre-e, ASA 162mg   Meds: No orders of the defined types were placed in this encounter.   Labs/procedures today: 1st IT/NT  Treatment Plan:  Growth u/s @ 20, 24, 28, 33, 36wks     2x/wk testing nst/sono @ 36wks or weekly BPP   Deliver @ 39-40wks  Reviewed: Preterm labor symptoms and general obstetric precautions including but not limited to vaginal bleeding, contractions, leaking of fluid and fetal movement were reviewed in detail with the patient.  All questions were answered.  Follow-up: Return in about 3 weeks (around 02/15/2019) for HROB, 2nd IT.  Orders Placed This Encounter  Procedures  . Integrated 1  . POC Urinalysis Dipstick OB   Cheral Marker  CNM, WHNP-BC 01/25/2019 3:37 PM

## 2019-01-25 NOTE — Progress Notes (Signed)
[place] 13 wks,measurement c/w dates,CRL 68.38 mm,anterior circumvallate placenta gr 0,fhr 150 bpm,NB present,NT 1.5 mm,normal left ovary,simple right ovarian cyst 4 x 4.1 x 3.6 cm

## 2019-01-27 LAB — INTEGRATED 1
Crown Rump Length: 68.4 mm
Gest. Age on Collection Date: 12.9 weeks
Maternal Age at EDD: 35.8 yr
Nuchal Translucency (NT): 1.5 mm
Number of Fetuses: 1
PAPP-A Value: 1581.9 ng/mL
Weight: 201 [lb_av]

## 2019-02-14 ENCOUNTER — Telehealth: Payer: Self-pay | Admitting: *Deleted

## 2019-02-14 ENCOUNTER — Encounter: Payer: Self-pay | Admitting: *Deleted

## 2019-02-14 NOTE — Telephone Encounter (Signed)
Vm not set up.  Mychart message sent to patient.

## 2019-02-15 ENCOUNTER — Ambulatory Visit (INDEPENDENT_AMBULATORY_CARE_PROVIDER_SITE_OTHER): Payer: BLUE CROSS/BLUE SHIELD | Admitting: Obstetrics & Gynecology

## 2019-02-15 ENCOUNTER — Other Ambulatory Visit: Payer: Self-pay

## 2019-02-15 VITALS — BP 136/92 | HR 117 | Wt 202.0 lb

## 2019-02-15 DIAGNOSIS — Z331 Pregnant state, incidental: Secondary | ICD-10-CM

## 2019-02-15 DIAGNOSIS — Z8759 Personal history of other complications of pregnancy, childbirth and the puerperium: Secondary | ICD-10-CM

## 2019-02-15 DIAGNOSIS — Z1379 Encounter for other screening for genetic and chromosomal anomalies: Secondary | ICD-10-CM | POA: Diagnosis not present

## 2019-02-15 DIAGNOSIS — Z1389 Encounter for screening for other disorder: Secondary | ICD-10-CM

## 2019-02-15 DIAGNOSIS — O0992 Supervision of high risk pregnancy, unspecified, second trimester: Secondary | ICD-10-CM

## 2019-02-15 DIAGNOSIS — O10912 Unspecified pre-existing hypertension complicating pregnancy, second trimester: Secondary | ICD-10-CM

## 2019-02-15 DIAGNOSIS — Z3A16 16 weeks gestation of pregnancy: Secondary | ICD-10-CM

## 2019-02-15 LAB — POCT URINALYSIS DIPSTICK OB
Blood, UA: NEGATIVE
Glucose, UA: NEGATIVE
Ketones, UA: NEGATIVE
Leukocytes, UA: NEGATIVE
Nitrite, UA: NEGATIVE
POC,PROTEIN,UA: NEGATIVE

## 2019-02-15 NOTE — Progress Notes (Signed)
   HIGH-RISK PREGNANCY VISIT Patient name: Mary Hardy MRN [id number]  Date of birth: 1981-11-02 Chief Complaint:   High Risk Gestation (BP check)  History of Present Illness:   Mary Hardy is a 38 y.o. [phone] female at [redacted]w[redacted]d with an Estimated Date of Delivery: 08/02/19 being seen today for ongoing management of a high-risk pregnancy complicated by chronic HTN.  Today she reports no complaints.  . Vag. Bleeding: None.   . denies leaking of fluid.  Review of Systems:   Pertinent items are noted in HPI Denies abnormal vaginal discharge w/ itching/odor/irritation, headaches, visual changes, shortness of breath, chest pain, abdominal pain, severe nausea/vomiting, or problems with urination or bowel movements unless otherwise stated above. Pertinent History Reviewed:  Reviewed past medical,surgical, social, obstetrical and family history.  Reviewed problem list, medications and allergies. Physical Assessment:   Vitals:   02/15/19 1533  BP: (!) 136/92  Pulse: (!) 117  Weight: 202 lb (91.6 kg)  Body mass index is 36.95 kg/m.           Physical Examination:   General appearance: alert, well appearing, and in no distress  Mental status: alert, oriented to person, place, and time  Skin: warm & dry   Extremities: Edema: Trace    Cardiovascular: normal heart rate noted  Respiratory: normal respiratory effort, no distress  Abdomen: gravid, soft, non-tender  Pelvic: Cervical exam deferred         Fetal Status:          Fetal Surveillance Testing today: FHR 160   Results for orders placed or performed in visit on 02/15/19 (from the past 24 hour(s))  POC Urinalysis Dipstick OB   Collection Time: 02/15/19  3:42 PM  Result Value Ref Range   Color, UA     Clarity, UA     Glucose, UA Negative Negative   Bilirubin, UA     Ketones, UA neg    Spec Grav, UA     Blood, UA neg    pH, UA     POC,PROTEIN,UA Negative Negative, Trace, Small (1+), Moderate (2+), Large (3+), 4+   Urobilinogen,  UA     Nitrite, UA neg    Leukocytes, UA Negative Negative   Appearance     Odor      Assessment & Plan:  1) High-risk pregnancy [id number] at [redacted]w[redacted]d with an Estimated Date of Delivery: 08/02/19   2) CHTN, stable, no indication for BP meds at this point but she is also in the part of pregnancy with highest progesterone levels which positively impact BP, likely to need meds in the coming weeks, pt is aware  3) Hx of eclampsia at 34 weeks, on ASA 162 mg  Meds: No orders of the defined types were placed in this encounter.   Labs/procedures today: 2nd IT  Treatment Plan:  Routine care at this point  Reviewed:  labor symptoms and general obstetric precautions including but not limited to vaginal bleeding, contractions, leaking of fluid and fetal movement were reviewed in detail with the patient.  All questions were answered.  Follow-up: Return in about 3 weeks (around 03/08/2019) for 20 week sono, HROB.  Orders Placed This Encounter  Procedures  . US OB Comp + 14 Wk  . INTEGRATED 2  . POC Urinalysis Dipstick OB   Lazaro Arms  02/15/2019 4:06 PM

## 2019-02-17 LAB — INTEGRATED 2
AFP MoM: 0.63
Alpha-Fetoprotein: 15.4 ng/mL
CROWN RUMP LENGTH: 68.4 mm
DIA MOM: 0.95
DIA Value: 134.3 pg/mL
Estriol, Unconjugated: 0.66 ng/mL
Gest. Age on Collection Date: 12.9 weeks
Gestational Age: 15.9 weeks
Maternal Age at EDD: 35.8 yr
Nuchal Translucency (NT): 1.5 mm
Nuchal Translucency MoM: 0.97
Number of Fetuses: 1
PAPP-A MoM: 2.07
PAPP-A Value: 1581.9 ng/mL
Test Results:: NEGATIVE
Weight: 201 [lb_av]
Weight: 201 [lb_av]
hCG MoM: 1.3
hCG Value: 41.1 IU/mL
uE3 MoM: 0.85

## 2019-03-07 ENCOUNTER — Encounter: Payer: Self-pay | Admitting: *Deleted

## 2019-03-07 ENCOUNTER — Other Ambulatory Visit: Payer: Self-pay | Admitting: Obstetrics & Gynecology

## 2019-03-07 DIAGNOSIS — Z363 Encounter for antenatal screening for malformations: Secondary | ICD-10-CM

## 2019-03-07 DIAGNOSIS — O0992 Supervision of high risk pregnancy, unspecified, second trimester: Secondary | ICD-10-CM

## 2019-03-08 ENCOUNTER — Ambulatory Visit (INDEPENDENT_AMBULATORY_CARE_PROVIDER_SITE_OTHER): Payer: No Typology Code available for payment source

## 2019-03-08 ENCOUNTER — Other Ambulatory Visit: Payer: Self-pay

## 2019-03-08 ENCOUNTER — Ambulatory Visit (INDEPENDENT_AMBULATORY_CARE_PROVIDER_SITE_OTHER): Payer: No Typology Code available for payment source | Admitting: Women's Health

## 2019-03-08 ENCOUNTER — Encounter: Payer: Self-pay | Admitting: Women's Health

## 2019-03-08 VITALS — BP 126/89 | HR 99 | Temp 99.0°F | Wt 206.0 lb

## 2019-03-08 DIAGNOSIS — O10912 Unspecified pre-existing hypertension complicating pregnancy, second trimester: Secondary | ICD-10-CM

## 2019-03-08 DIAGNOSIS — O0990 Supervision of high risk pregnancy, unspecified, unspecified trimester: Secondary | ICD-10-CM

## 2019-03-08 DIAGNOSIS — Z3A19 19 weeks gestation of pregnancy: Secondary | ICD-10-CM

## 2019-03-08 DIAGNOSIS — Z331 Pregnant state, incidental: Secondary | ICD-10-CM

## 2019-03-08 DIAGNOSIS — Z363 Encounter for antenatal screening for malformations: Secondary | ICD-10-CM

## 2019-03-08 DIAGNOSIS — O0992 Supervision of high risk pregnancy, unspecified, second trimester: Secondary | ICD-10-CM

## 2019-03-08 DIAGNOSIS — I10 Essential (primary) hypertension: Secondary | ICD-10-CM

## 2019-03-08 DIAGNOSIS — Z1389 Encounter for screening for other disorder: Secondary | ICD-10-CM

## 2019-03-08 LAB — POCT URINALYSIS DIPSTICK OB
Blood, UA: NEGATIVE
Glucose, UA: NEGATIVE
Glucose, UA: NEGATIVE
Leukocytes, UA: NEGATIVE
Nitrite, UA: NEGATIVE

## 2019-03-08 NOTE — Progress Notes (Signed)
   TELEHEALTH VIRTUAL OBSTETRICS VISIT ENCOUNTER NOTE  I connected with Mary Hardy on 03/08/19 at  3:45 PM EDT by  [facility] at home and verified that I am speaking with the correct person using two identifiers.   I discussed the limitations, risks, security and privacy concerns of performing an evaluation and management service by telephone and the availability of in person appointments. I also discussed with the patient that there may be a patient responsible charge related to this service. The patient expressed understanding and agreed to proceed.  Subjective:  Mary Hardy is a 38 y.o. [id number] at [redacted]w[redacted]d being followed for ongoing prenatal care.  She is currently monitored for the following issues for this high-risk pregnancy d/t CHTN- no meds, and has History of preterm delivery; Supervision of high risk pregnancy, antepartum; H/O eclampsia, then readmit for pp pre-e; SUI (stress urinary incontinence, female); Depression with anxiety; Marijuana use; and Chronic hypertension affecting pregnancy on their problem list.  Patient reports no complaints. Home bp's 120s/80s, may hit 140s/90s occ when has been working out in garden, Catering manager. Reports fetal movement. Denies any contractions, bleeding or leaking of fluid.   The following portions of the patient's history were reviewed and updated as appropriate: allergies, current medications, past family history, past medical history, past social history, past surgical history and problem list.   Objective:   General:  Alert, oriented and cooperative.   Mental Status: Normal mood and affect perceived. Normal judgment and thought content.  Rest of physical exam deferred due to type of encounter BP 126/89   Pulse 99   Temp 99 F (37.2 C)   Wt 206 lb (93.4 kg)   LMP 08/03/2018 (Approximate)   BMI 37.68 kg/m   Anatomy US 19 wks,breech,cx 4.6 cm,anterior placenta gr 0,svp of fluid 5.3 cm,simple right ovarian cyst 3.5 x 3.7 x 3.7 cm,left ovary not  visualized,fhr 152 bpm,efw 290 g 69%,anatomy complete,no obvious abnormalities  Assessment and Plan:  Pregnancy: [id number] at [redacted]w[redacted]d 1. High risk pregnancy d/t CHTN> no meds, doing well, continue ASA, checking bp's as directed  2. H/O eclampsia, pp pre-e>continue ASA  Preterm labor symptoms and general obstetric precautions including but not limited to vaginal bleeding, contractions, leaking of fluid and fetal movement were reviewed in detail with the patient.  I discussed the assessment and treatment plan with the patient. The patient was provided an opportunity to ask questions and all were answered. The patient agreed with the plan and demonstrated an understanding of the instructions. The patient was advised to call back or seek an in-person office evaluation/go to [facility]U at [facility] for any urgent or concerning symptoms. Please refer to After Visit Summary for other counseling recommendations.   I provided 10 minutes of non-face-to-face time during this encounter.  Return in about 4 weeks (around 04/05/2019) for HROB, US:EFW.  Future Appointments  Date Time Provider Department Center  04/05/2019  3:30 PM [facility] [place] [facility] None  04/05/2019  4:00 PM Tilda Burrow, MD [facility]    Cheral Marker, CNM [facility] [employer], [facility]

## 2019-03-08 NOTE — Progress Notes (Signed)
[place] 19 wks,breech,cx 4.6 cm,anterior placenta gr 0,svp of fluid 5.3 cm,simple right ovarian cyst 3.5 x 3.7 x 3.7 cm,left ovary not visualized,fhr 152 bpm,efw 290 g 69%,anatomy complete,no obvious abnormalities

## 2019-03-21 ENCOUNTER — Inpatient Hospital Stay (HOSPITAL_COMMUNITY)
Admission: AD | Admit: 2019-03-21 | Discharge: 2019-03-22 | Disposition: A | Discharge status: Home / Self Care | Payer: No Typology Code available for payment source | Attending: Obstetrics and Gynecology | Admitting: Obstetrics and Gynecology

## 2019-03-21 ENCOUNTER — Other Ambulatory Visit: Payer: Self-pay

## 2019-03-21 DIAGNOSIS — Z87891 Personal history of nicotine dependence: Secondary | ICD-10-CM | POA: Insufficient documentation

## 2019-03-21 DIAGNOSIS — R51 Headache: Secondary | ICD-10-CM | POA: Insufficient documentation

## 2019-03-21 DIAGNOSIS — Z3A21 21 weeks gestation of pregnancy: Secondary | ICD-10-CM

## 2019-03-21 DIAGNOSIS — Z79899 Other long term (current) drug therapy: Secondary | ICD-10-CM | POA: Insufficient documentation

## 2019-03-21 DIAGNOSIS — F419 Anxiety disorder, unspecified: Secondary | ICD-10-CM | POA: Insufficient documentation

## 2019-03-21 DIAGNOSIS — O10919 Unspecified pre-existing hypertension complicating pregnancy, unspecified trimester: Secondary | ICD-10-CM | POA: Insufficient documentation

## 2019-03-21 DIAGNOSIS — Z3A34 34 weeks gestation of pregnancy: Secondary | ICD-10-CM | POA: Insufficient documentation

## 2019-03-21 DIAGNOSIS — O99343 Other mental disorders complicating pregnancy, third trimester: Secondary | ICD-10-CM | POA: Insufficient documentation

## 2019-03-21 DIAGNOSIS — O26893 Other specified pregnancy related conditions, third trimester: Secondary | ICD-10-CM | POA: Insufficient documentation

## 2019-03-21 DIAGNOSIS — F329 Major depressive disorder, single episode, unspecified: Secondary | ICD-10-CM | POA: Insufficient documentation

## 2019-03-21 DIAGNOSIS — Z7982 Long term (current) use of aspirin: Secondary | ICD-10-CM | POA: Insufficient documentation

## 2019-03-21 HISTORY — DX: Major depressive disorder, single episode, unspecified: F32.9

## 2019-03-21 HISTORY — DX: Essential (primary) hypertension: I10

## 2019-03-21 HISTORY — DX: Anxiety disorder, unspecified: F41.9

## 2019-03-21 HISTORY — DX: Eclampsia, unspecified as to time period: O15.9

## 2019-03-21 HISTORY — DX: Depression, unspecified: F32.A

## 2019-03-22 ENCOUNTER — Encounter (HOSPITAL_COMMUNITY): Payer: Self-pay | Admitting: *Deleted

## 2019-03-22 DIAGNOSIS — Z3A21 21 weeks gestation of pregnancy: Secondary | ICD-10-CM

## 2019-03-22 DIAGNOSIS — O10912 Unspecified pre-existing hypertension complicating pregnancy, second trimester: Secondary | ICD-10-CM

## 2019-03-22 DIAGNOSIS — F329 Major depressive disorder, single episode, unspecified: Secondary | ICD-10-CM | POA: Diagnosis not present

## 2019-03-22 DIAGNOSIS — Z3A34 34 weeks gestation of pregnancy: Secondary | ICD-10-CM | POA: Diagnosis not present

## 2019-03-22 DIAGNOSIS — O10919 Unspecified pre-existing hypertension complicating pregnancy, unspecified trimester: Secondary | ICD-10-CM | POA: Diagnosis not present

## 2019-03-22 DIAGNOSIS — O99343 Other mental disorders complicating pregnancy, third trimester: Secondary | ICD-10-CM | POA: Diagnosis not present

## 2019-03-22 DIAGNOSIS — Z87891 Personal history of nicotine dependence: Secondary | ICD-10-CM | POA: Diagnosis not present

## 2019-03-22 DIAGNOSIS — Z79899 Other long term (current) drug therapy: Secondary | ICD-10-CM | POA: Diagnosis not present

## 2019-03-22 DIAGNOSIS — R51 Headache: Secondary | ICD-10-CM | POA: Diagnosis not present

## 2019-03-22 DIAGNOSIS — F419 Anxiety disorder, unspecified: Secondary | ICD-10-CM | POA: Diagnosis not present

## 2019-03-22 DIAGNOSIS — O26893 Other specified pregnancy related conditions, third trimester: Secondary | ICD-10-CM | POA: Diagnosis present

## 2019-03-22 DIAGNOSIS — Z7982 Long term (current) use of aspirin: Secondary | ICD-10-CM | POA: Diagnosis not present

## 2019-03-22 LAB — URINALYSIS, ROUTINE W REFLEX MICROSCOPIC
Bilirubin Urine: NEGATIVE
Glucose, UA: NEGATIVE mg/dL
Ketones, ur: NEGATIVE mg/dL
Nitrite: NEGATIVE
Protein, ur: 30 mg/dL — AB
RBC / HPF: >50 RBC/hpf — ABNORMAL HIGH (ref 0–5)
Specific Gravity, Urine: 1.018 (ref 1.005–1.030)
pH: 6 (ref 5.0–8.0)

## 2019-03-22 LAB — COMPREHENSIVE METABOLIC PANEL
ALT: 15 U/L (ref 0–44)
AST: 17 U/L (ref 15–41)
Albumin: 2.8 g/dL — ABNORMAL LOW (ref 3.5–5.0)
Alkaline Phosphatase: 67 U/L (ref 38–126)
Anion gap: 12 (ref 5–15)
BUN: 5 mg/dL — ABNORMAL LOW (ref 6–20)
CO2: 21 mmol/L — ABNORMAL LOW (ref 22–32)
Calcium: 9.5 mg/dL (ref 8.9–10.3)
Chloride: 103 mmol/L (ref 98–111)
Creatinine, Ser: 0.65 mg/dL (ref 0.44–1.00)
GFR calc Af Amer: >60 mL/min (ref >60)
GFR calc non Af Amer: >60 mL/min (ref >60)
Glucose, Bld: 111 mg/dL — ABNORMAL HIGH (ref 70–99)
Potassium: 3.6 mmol/L (ref 3.5–5.1)
Sodium: 136 mmol/L (ref 135–145)
Total Bilirubin: 0.2 mg/dL — ABNORMAL LOW (ref 0.3–1.2)
Total Protein: 6 g/dL — ABNORMAL LOW (ref 6.5–8.1)

## 2019-03-22 LAB — CBC
HCT: 33.7 % — ABNORMAL LOW (ref 36.0–46.0)
Hemoglobin: 11.5 g/dL — ABNORMAL LOW (ref 12.0–15.0)
MCH: 30.8 pg (ref 26.0–34.0)
MCHC: 34.1 g/dL (ref 30.0–36.0)
MCV: 90.3 fL (ref 80.0–100.0)
Platelets: 254 10*3/uL (ref 150–400)
RBC: 3.73 MIL/uL — ABNORMAL LOW (ref 3.87–5.11)
RDW: 13 % (ref 11.5–15.5)
WBC: 16.1 10*3/uL — ABNORMAL HIGH (ref 4.0–10.5)
nRBC: 0 % (ref 0.0–0.2)

## 2019-03-22 LAB — PROTEIN / CREATININE RATIO, URINE
Creatinine, Urine: 166.19 mg/dL
Protein Creatinine Ratio: 0.25 mg/mg{Cre} — ABNORMAL HIGH (ref 0.00–0.15)
Total Protein, Urine: 41 mg/dL

## 2019-03-22 NOTE — MAU Provider Note (Signed)
Chief Complaint: Headache   First Provider Initiated Contact with Patient 03/22/19 0034     SUBJECTIVE HPI: Mary Hardy is a 38 y.o. [id number] at [redacted]w[redacted]d who presents to Maternity Admissions reporting headache & elevated BP. Previous pregnancy complicated by eclampsia at 34 wks & PP PEC. Diagnosed with CHTN at the beginning of this pregnancy & is taking BASA.  Reports her BPs at home today were 120s/high 90s. States she's normally 120s/80s. Has had headache off & on today. Has not treated headache & currently denies any pain. Denies visual disturbance or epigastric pain.   Past Medical History:  Diagnosis Date  . Anemia   . Anxiety   . Chronic hypertension   . Depression   . Eclampsia (toxemia of pregnancy)   . History of eating disorder   . Incontinence of urine    due to nerve and muscle damage during child birth   OB History  Gravida Para Term Preterm AB Living  4 1   1 2 1   SAB TAB Ectopic Multiple Live Births  2       1    # Outcome Date GA Lbr Len/2nd Weight Sex Delivery Anes PTL Lv  4 Current           3 SAB 2017          2 Preterm 08/12/13 [redacted]w[redacted]d  2260 g F Vag-Spont EPI  LIV     Complications: Eclampsia  1 SAB            Past Surgical History:  Procedure Laterality Date  . GALLBLADDER SURGERY    . MOUTH SURGERY     dental  . WISDOM TOOTH EXTRACTION     Social History   Socioeconomic History  . Marital status: Married    Spouse name: Reuel Boom  . Number of children: 1  . Years of education: Not on file  . Highest education level: Not on file  Occupational History  . Not on file  Social Needs  . Financial resource strain: Not on file  . Food insecurity:    Worry: Not on file    Inability: Not on file  . Transportation needs:    Medical: Not on file    Non-medical: Not on file  Tobacco Use  . Smoking status: Former Smoker    Types: E-cigarettes    Last attempt to quit: 03/21/2016    Years since quitting: 3.0  . Smokeless tobacco: Never Used  Substance and  Sexual Activity  . Alcohol use: Not Currently  . Drug use: Never  . Sexual activity: Yes    Birth control/protection: None  Lifestyle  . Physical activity:    Days per week: Not on file    Minutes per session: Not on file  . Stress: Not on file  Relationships  . Social connections:    Talks on phone: Not on file    Gets together: Not on file    Attends religious service: Not on file    Active member of club or organization: Not on file    Attends meetings of clubs or organizations: Not on file    Relationship status: Not on file  . Intimate partner violence:    Fear of current or ex partner: Not on file    Emotionally abused: Not on file    Physically abused: Not on file    Forced sexual activity: Not on file  Other Topics Concern  . Not on file  Social History Narrative  .  Not on file   Family History  Problem Relation Age of Onset  . Glaucoma Paternal Grandfather   . Heart attack Paternal Grandfather   . Glaucoma Paternal Grandmother   . Heart attack Maternal Grandmother   . Heart disease Maternal Grandmother   . Diabetes Maternal Grandmother   . Heart attack Maternal Grandfather   . Heart disease Maternal Grandfather   . Carpal tunnel syndrome Mother   . Fibromyalgia Mother   . Heart disease Mother   . ADD / ADHD Daughter   . Cancer Maternal Aunt   . Heart attack Maternal Uncle    No current facility-administered medications on file prior to encounter.    Current Outpatient Medications on File Prior to Encounter  Medication Sig Dispense Refill  . aspirin EC 81 MG tablet Take 162 mg by mouth daily.    . Calcium Carbonate Antacid (TUMS PO) Take by mouth as needed.    Marland Kitchen. escitalopram (LEXAPRO) 10 MG tablet Take 1 tablet (10 mg total) by mouth daily. 30 tablet 6  . prenatal vitamin w/FE, FA (PRENATAL 1 + 1) 27-1 MG TABS tablet Take 1 tablet by mouth daily at 12 noon. 30 each 11   Allergies  Allergen Reactions  . Proanthocyanidin Anaphylaxis    Including grape  juice, extracts, etc., reaction, throat gets scratchy, Pt gets short of breath, breaks out in rash also.    I have reviewed patient's Past Medical Hx, Surgical Hx, Family Hx, Social Hx, medications and allergies.   Review of Systems  Constitutional: Negative.   Eyes: Negative for visual disturbance.  Gastrointestinal: Negative.   Genitourinary: Negative.   Neurological: Positive for headaches (currently no pain).    OBJECTIVE Patient Vitals for the past 24 hrs:  BP Temp Temp src Pulse Resp Weight  03/22/19 0117 120/84 - - 96 - -  03/22/19 0029 125/66 - - (!) 104 16 -  03/22/19 0004 133/89 98.5 F (36.9 C) Oral 95 17 99.3 kg   Constitutional: Well-developed, well-nourished female in no acute distress.  Cardiovascular: normal rate & rhythm, no murmur Respiratory: normal rate and effort. Lung sounds clear throughout GI: Abd soft, non-tender, Pos BS x 4. No guarding or rebound tenderness MS: Extremities nontender, no edema, normal ROM Neurologic: Alert and oriented x 4.      LAB RESULTS Results for orders placed or performed during the hospital encounter of 03/21/19 (from the past 24 hour(s))  Protein / creatinine ratio, urine     Status: Abnormal   Collection Time: 03/22/19 12:43 AM  Result Value Ref Range   Creatinine, Urine 166.19 mg/dL   Total Protein, Urine 41 mg/dL   Protein Creatinine Ratio 0.25 (H) 0.00 - 0.15 mg/mg[Cre]  Urinalysis, Routine w reflex microscopic     Status: Abnormal   Collection Time: 03/22/19 12:44 AM  Result Value Ref Range   Color, Urine YELLOW YELLOW   APPearance HAZY (A) CLEAR   Specific Gravity, Urine 1.018 1.005 - 1.030   pH 6.0 5.0 - 8.0   Glucose, UA NEGATIVE NEGATIVE mg/dL   Hgb urine dipstick LARGE (A) NEGATIVE   Bilirubin Urine NEGATIVE NEGATIVE   Ketones, ur NEGATIVE NEGATIVE mg/dL   Protein, ur 30 (A) NEGATIVE mg/dL   Nitrite NEGATIVE NEGATIVE   Leukocytes,Ua MODERATE (A) NEGATIVE   RBC / HPF >50 (H) 0 - 5 RBC/hpf   WBC, UA 21-50  0 - 5 WBC/hpf   Bacteria, UA FEW (A) NONE SEEN   Squamous Epithelial / LPF 11-20 0 -  5   Mucus PRESENT    Ca Oxalate Crys, UA PRESENT   CBC     Status: Abnormal   Collection Time: 03/22/19 12:57 AM  Result Value Ref Range   WBC 16.1 (H) 4.0 - 10.5 K/uL   RBC 3.73 (L) 3.87 - 5.11 MIL/uL   Hemoglobin 11.5 (L) 12.0 - 15.0 g/dL   HCT 81.1 (L) 91.4 - 78.2 %   MCV 90.3 80.0 - 100.0 fL   MCH 30.8 26.0 - 34.0 pg   MCHC 34.1 30.0 - 36.0 g/dL   RDW 95.6 21.3 - 08.6 %   Platelets 254 150 - 400 K/uL   nRBC 0.0 0.0 - 0.2 %  Comprehensive metabolic panel     Status: Abnormal   Collection Time: 03/22/19 12:57 AM  Result Value Ref Range   Sodium 136 135 - 145 mmol/L   Potassium 3.6 3.5 - 5.1 mmol/L   Chloride 103 98 - 111 mmol/L   CO2 21 (L) 22 - 32 mmol/L   Glucose, Bld 111 (H) 70 - 99 mg/dL   BUN 5 (L) 6 - 20 mg/dL   Creatinine, Ser 5.78 0.44 - 1.00 mg/dL   Calcium 9.5 8.9 - 46.9 mg/dL   Total Protein 6.0 (L) 6.5 - 8.1 g/dL   Albumin 2.8 (L) 3.5 - 5.0 g/dL   AST 17 15 - 41 U/L   ALT 15 0 - 44 U/L   Alkaline Phosphatase 67 38 - 126 U/L   Total Bilirubin 0.2 (L) 0.3 - 1.2 mg/dL   GFR calc non Af Amer >60 >60 mL/min   GFR calc Af Amer >60 >60 mL/min   Anion gap 12 5 - 15    IMAGING No results found.  MAU COURSE Orders Placed This Encounter  Procedures  . Urinalysis, Routine w reflex microscopic  . CBC  . Comprehensive metabolic panel  . Protein / creatinine ratio, urine  . Discharge patient   No orders of the defined types were placed in this encounter.   MDM FHT present via doppler  Pt normotensive in MAU today with reassuring PEC labs. Currently denies headache. Discussed tx of headaches at home & reasons to return to MAU.   ASSESSMENT 1. Chronic hypertension affecting pregnancy   2. [redacted] weeks gestation of pregnancy     PLAN Discharge home in stable condition. PEC precautions  Allergies as of 03/22/2019      Reactions   Proanthocyanidin Anaphylaxis   Including  grape juice, extracts, etc., reaction, throat gets scratchy, Pt gets short of breath, breaks out in rash also.      Medication List    TAKE these medications   aspirin EC 81 MG tablet Take 162 mg by mouth daily.   escitalopram 10 MG tablet Commonly known as:  LEXAPRO Take 1 tablet (10 mg total) by mouth daily.   prenatal vitamin w/FE, FA 27-1 MG Tabs tablet Take 1 tablet by mouth daily at 12 noon.   TUMS PO Take by mouth as needed.        Judeth Horn, NP 03/22/2019  1:46 AM

## 2019-03-22 NOTE — Discharge Instructions (Signed)
Hypertension During Pregnancy ° °Hypertension, commonly called high blood pressure, is when the force of blood pumping through your arteries is too strong. Arteries are blood vessels that carry blood from the heart throughout the body. Hypertension during pregnancy can cause problems for you and your baby. Your baby may be born early (prematurely) or may not weigh as much as he or she should at birth. Very bad cases of hypertension during pregnancy can be life-threatening. °Different types of hypertension can occur during pregnancy. These include: °· Chronic hypertension. This happens when: °? You have hypertension before pregnancy and it continues during pregnancy. °? You develop hypertension before you are [redacted] weeks pregnant, and it continues during pregnancy. °· Gestational hypertension. This is hypertension that develops after the 20th week of pregnancy. °· Preeclampsia, also called toxemia of pregnancy. This is a very serious type of hypertension that develops during pregnancy. It can be very dangerous for you and your baby. °? In rare cases, you may develop preeclampsia after giving birth (postpartum preeclampsia). This usually occurs within 48 hours after childbirth but may occur up to 6 weeks after giving birth. °Gestational hypertension and preeclampsia usually go away within 6 weeks after your baby is born. Women who have hypertension during pregnancy have a greater chance of developing hypertension later in life or during future pregnancies. °What are the causes? °The exact cause of hypertension during pregnancy is not known. °What increases the risk? °There are certain factors that make it more likely for you to develop hypertension during pregnancy. These include: °· Having hypertension during a previous pregnancy or prior to pregnancy. °· Being overweight. °· Being age 35 or older. °· Being pregnant for the first time. °· Being pregnant with more than one baby. °· Becoming pregnant using fertilization  methods such as IVF (in vitro fertilization). °· Having diabetes, kidney problems, or systemic lupus erythematosus. °· Having a family history of hypertension. °What are the signs or symptoms? °Chronic hypertension and gestational hypertension rarely cause symptoms. Preeclampsia causes symptoms, which may include: °· Increased protein in your urine. Your health care provider will check for this at every visit before you give birth (prenatal visit). °· Severe headaches. °· Sudden weight gain. °· Swelling of the hands, face, legs, and feet. °· Nausea and vomiting. °· Vision problems, such as blurred or double vision. °· Numbness in the face, arms, legs, and feet. °· Dizziness. °· Slurred speech. °· Sensitivity to bright lights. °· Abdominal pain. °· Convulsions or seizures. °How is this diagnosed? °You may be diagnosed with hypertension during a routine prenatal exam. At each prenatal visit, you may: °· Have a urine test to check for high amounts of protein in your urine. °· Have your blood pressure checked. A blood pressure reading is given as two numbers, such as "120 over 80" (or 120/80). The first ("top") number is a measure of the pressure in your arteries when your heart beats (systolic pressure). The second ("bottom") number is a measure of the pressure in your arteries as your heart relaxes between beats (diastolic pressure). Blood pressure is measured in a unit called mm Hg. For most women, a normal blood pressure reading is: °? Systolic: below 120. °? Diastolic: below 80. °The type of hypertension that you are diagnosed with depends on your test results and when your symptoms developed. °· Chronic hypertension is usually diagnosed before 20 weeks of pregnancy. °· Gestational hypertension is usually diagnosed after 20 weeks of pregnancy. °· Hypertension with high amounts of protein in   the urine is diagnosed as preeclampsia. °· Blood pressure measurements that stay above 160 systolic, or above 110 diastolic,  are signs of severe preeclampsia. °How is this treated? °Treatment for hypertension during pregnancy varies depending on the type of hypertension you have and how serious it is. °· If you take medicines called ACE inhibitors to treat chronic hypertension, you may need to switch medicines. ACE inhibitors should not be taken during pregnancy. °· If you have gestational hypertension, you may need to take blood pressure medicine. °· If you are at risk for preeclampsia, your health care provider may recommend that you take a low-dose aspirin during your pregnancy. °· If you have severe preeclampsia, you may need to be hospitalized so you and your baby can be monitored closely. You may also need to take medicine (magnesium sulfate) to prevent seizures and to lower blood pressure. This medicine may be given as an injection or through an IV. °· In some cases, if your condition gets worse, you may need to deliver your baby early. °Follow these instructions at home: °Eating and drinking ° °· Drink enough fluid to keep your urine pale yellow. °· Avoid caffeine. °Lifestyle °· Do not use any products that contain nicotine or tobacco, such as cigarettes and e-cigarettes. If you need help quitting, ask your health care provider. °· Do not use alcohol or drugs. °· Avoid stress as much as possible. Rest and get plenty of sleep. °General instructions °· Take over-the-counter and prescription medicines only as told by your health care provider. °· While lying down, lie on your left side. This keeps pressure off your major blood vessels. °· While sitting or lying down, raise (elevate) your feet. Try putting some pillows under your lower legs. °· Exercise regularly. Ask your health care provider what kinds of exercise are best for you. °· Keep all prenatal and follow-up visits as told by your health care provider. This is important. °Contact a health care provider if: °· You have symptoms that your health care provider told you may  require more treatment or monitoring, such as: °? Nausea or vomiting. °? Headache. °Get help right away if you have: °· Severe abdominal pain that does not get better with treatment. °· A severe headache that does not get better. °· Vomiting that does not get better. °· Sudden, rapid weight gain. °· Sudden swelling in your hands, ankles, or face. °· Vaginal bleeding. °· Blood in your urine. °· Fewer movements from your baby than usual. °· Blurred or double vision. °· Muscle twitching or sudden muscle tightening (spasms). °· Shortness of breath. °· Blue fingernails or lips. °Summary °· Hypertension, commonly called high blood pressure, is when the force of blood pumping through your arteries is too strong. °· Hypertension during pregnancy can cause problems for you and your baby. °· Treatment for hypertension during pregnancy varies depending on the type of hypertension you have and how serious it is. °· Get help right away if you have symptoms that your health care provider told you to watch for. °This information is not intended to replace advice given to you by your health care provider. Make sure you discuss any questions you have with your health care provider. °Document Released: 07/29/2011 Document Revised: 10/27/2017 Document Reviewed: 04/25/2016 °Elsevier Interactive Patient Education © 2019 [employer] ° °

## 2019-03-22 NOTE — MAU Note (Signed)
Pt presents to MAU c/o increased BP pt states all day the BP's have been high 120's/ high 90's. Pt reports she is taking a baby Asprin. Pt reports she has not done anything strenuous today. Pt reports no bleeding or LOF. +FM. Pt reports HA that is coming and going today. Pt states she only sees spots if she stands too fast. Pt states her hands and feet go numb often. Pt reports some swelling.

## 2019-04-04 ENCOUNTER — Encounter: Payer: Self-pay | Admitting: *Deleted

## 2019-04-05 ENCOUNTER — Encounter: Payer: No Typology Code available for payment source | Admitting: Obstetrics and Gynecology

## 2019-04-05 ENCOUNTER — Other Ambulatory Visit: Payer: No Typology Code available for payment source

## 2019-04-08 ENCOUNTER — Encounter: Payer: Self-pay | Admitting: *Deleted

## 2019-04-11 ENCOUNTER — Ambulatory Visit (INDEPENDENT_AMBULATORY_CARE_PROVIDER_SITE_OTHER): Payer: No Typology Code available for payment source | Admitting: Obstetrics and Gynecology

## 2019-04-11 ENCOUNTER — Ambulatory Visit: Payer: No Typology Code available for payment source

## 2019-04-11 ENCOUNTER — Other Ambulatory Visit: Payer: Self-pay

## 2019-04-11 DIAGNOSIS — O0992 Supervision of high risk pregnancy, unspecified, second trimester: Secondary | ICD-10-CM

## 2019-04-11 DIAGNOSIS — Z3A23 23 weeks gestation of pregnancy: Secondary | ICD-10-CM

## 2019-04-11 DIAGNOSIS — O10912 Unspecified pre-existing hypertension complicating pregnancy, second trimester: Secondary | ICD-10-CM

## 2019-04-11 DIAGNOSIS — O10919 Unspecified pre-existing hypertension complicating pregnancy, unspecified trimester: Secondary | ICD-10-CM

## 2019-04-11 MED ORDER — LABETALOL HCL 100 MG PO TABS: 100.0000 mg | ORAL_TABLET | Freq: Two times a day (BID) | ORAL | 3 refills | Status: DC

## 2019-04-11 NOTE — Progress Notes (Signed)
Patient ID: [name], female   DOB: [birth year], 38 y.o.   MRN: [id number]    [facility] PREGNANCY VISIT Patient name: Mary Hardy MRN [id number]  Date of birth: 1981-11-07 Chief Complaint:   High Risk Gestation (had symptoms covid-19/seem out side in car) Seen outside at Dr Fletcher Anon outside exam site.  No fever,  History of Present Illness:   Mary Hardy is a 38 y.o. [phone] female at [redacted]w[redacted]d with an Estimated Date of Delivery: 08/02/19 being seen today for ongoing management of a high-risk pregnancy complicated by chronic HTN, hx of preeclampsia.  She has been getting BP elevations at home to 140's systolic and from 80 to 100's diastolic at home. Advised to report those numbers to office weekly via MyChart going forward.  SYMPTOMS OF COVID-19 SEEN OUTSIDE OF OFFICE IN OF CAR no fever Today she reports fatigue and suprapubic discomfort, pressure, loss of urine control from damage of first delivery has been worsened..  .  .   . denies leaking of fluid.  Review of Systems:   Pertinent items are noted in HPI Pt reports she had bladder function testing after last delivery at [facility], and that she then moved to [place], then back. She plans to f/u with doctors at [facility] in the future postpartum. Denies abnormal vaginal discharge w/ itching/odor/irritation, headaches, visual changes, shortness of breath, chest pain, abdominal pain, severe nausea/vomiting, or problems with urination or bowel movements unless otherwise stated above. Pertinent History Reviewed:  Reviewed past medical,surgical, social, obstetrical and family history.  Reviewed problem list, medications and allergies. Physical Assessment:  There were no vitals filed for this visit.There is no height or weight on file to calculate BMI.     BP 146/88 recheck 144/ 86 shortly after ambulation      Physical Examination:   General appearance: alert, well appearing, and in no distress, overweight and able to ambulate without difficulty  but low exertion tolerance. No PND.  Mental status: alert, oriented to person, place, and time, normal mood, behavior, speech, dress, motor activity, and thought processes  Skin: warm & dry   Extremities:      Cardiovascular: normal heart rate noted  Respiratory: normal respiratory effort, no distress  Abdomen: gravid, soft, non-tender fetal movenent palpated and auscutated by doppler.  Pelvic: Cervical exam deferred         Fetal Status:          Fetal Surveillance Testing today: Fetal movement confirmed.   No results found for this or any previous visit (from the past 24 hour(s)).  Assessment & Plan:  1) High-risk pregnancy [id number] at [redacted]w[redacted]d with an Estimated Date of Delivery: 08/02/19   2) CHTN, stable will begin Labetalol at 100 mg bid  3) Obesity with limited exertion tolerance.  4. Hx vaginal delivery with "bladder nerve damage" by pt hx, stable would seek records, but anticipate vaginal delivery.  Meds: No orders of the defined types were placed in this encounter.   Labs/procedures today:   Treatment Plan:  Add labetalol 100 mg bid to tx.  pt to continue to monitor bp's at home  Reviewed:  contractions, leaking of fluid and fetal movement were reviewed in detail with the patient.  All questions were answered.  Follow-up: No follow-ups on file.  No orders of the defined types were placed in this encounter.  By signing my name below, I, Arnette Norris, attest that this documentation has been prepared under the direction and in the presence of Tilda Burrow, MD.  Electronically Signed: Arnette NorrisMari Hardy Medical Scribe. 04/11/19. 9:25 AM.  .hjfs I personally performed the services described in this documentation, which was SCRIBED in my presence. The recorded information has been reviewed and considered accurate. It has been edited as necessary during review. Tilda BurrowJohn V Mishell Donalson, MD

## 2019-04-25 ENCOUNTER — Encounter: Payer: No Typology Code available for payment source | Admitting: Women's Health

## 2019-04-29 ENCOUNTER — Inpatient Hospital Stay (HOSPITAL_COMMUNITY): Payer: No Typology Code available for payment source

## 2019-04-29 ENCOUNTER — Inpatient Hospital Stay (HOSPITAL_COMMUNITY)
Admission: AD | Admit: 2019-04-29 | Discharge: 2019-04-29 | Disposition: A | Discharge status: Home / Self Care | Payer: No Typology Code available for payment source | Attending: Obstetrics and Gynecology | Admitting: Obstetrics and Gynecology

## 2019-04-29 ENCOUNTER — Other Ambulatory Visit: Payer: Self-pay

## 2019-04-29 ENCOUNTER — Telehealth: Payer: Self-pay | Admitting: *Deleted

## 2019-04-29 ENCOUNTER — Encounter (HOSPITAL_COMMUNITY): Payer: Self-pay | Admitting: *Deleted

## 2019-04-29 DIAGNOSIS — O9989 Other specified diseases and conditions complicating pregnancy, childbirth and the puerperium: Secondary | ICD-10-CM

## 2019-04-29 DIAGNOSIS — Z3A26 26 weeks gestation of pregnancy: Secondary | ICD-10-CM | POA: Diagnosis not present

## 2019-04-29 DIAGNOSIS — Z1159 Encounter for screening for other viral diseases: Secondary | ICD-10-CM | POA: Diagnosis not present

## 2019-04-29 DIAGNOSIS — Z87891 Personal history of nicotine dependence: Secondary | ICD-10-CM | POA: Insufficient documentation

## 2019-04-29 DIAGNOSIS — O26892 Other specified pregnancy related conditions, second trimester: Secondary | ICD-10-CM | POA: Insufficient documentation

## 2019-04-29 DIAGNOSIS — R0602 Shortness of breath: Secondary | ICD-10-CM | POA: Diagnosis present

## 2019-04-29 DIAGNOSIS — O99891 Other specified diseases and conditions complicating pregnancy: Secondary | ICD-10-CM

## 2019-04-29 DIAGNOSIS — O10912 Unspecified pre-existing hypertension complicating pregnancy, second trimester: Secondary | ICD-10-CM

## 2019-04-29 DIAGNOSIS — O10012 Pre-existing essential hypertension complicating pregnancy, second trimester: Secondary | ICD-10-CM | POA: Diagnosis not present

## 2019-04-29 DIAGNOSIS — O10919 Unspecified pre-existing hypertension complicating pregnancy, unspecified trimester: Secondary | ICD-10-CM

## 2019-04-29 LAB — COMPREHENSIVE METABOLIC PANEL
ALT: 22 U/L (ref 0–44)
AST: 21 U/L (ref 15–41)
Albumin: 3 g/dL — ABNORMAL LOW (ref 3.5–5.0)
Alkaline Phosphatase: 90 U/L (ref 38–126)
Anion gap: 12 (ref 5–15)
BUN: 6 mg/dL (ref 6–20)
CO2: 19 mmol/L — ABNORMAL LOW (ref 22–32)
Calcium: 9.1 mg/dL (ref 8.9–10.3)
Chloride: 105 mmol/L (ref 98–111)
Creatinine, Ser: 0.81 mg/dL (ref 0.44–1.00)
GFR calc Af Amer: >60 mL/min (ref >60)
GFR calc non Af Amer: >60 mL/min (ref >60)
Glucose, Bld: 162 mg/dL — ABNORMAL HIGH (ref 70–99)
Potassium: 4.2 mmol/L (ref 3.5–5.1)
Sodium: 136 mmol/L (ref 135–145)
Total Bilirubin: 0.5 mg/dL (ref 0.3–1.2)
Total Protein: 6.1 g/dL — ABNORMAL LOW (ref 6.5–8.1)

## 2019-04-29 LAB — URINALYSIS, ROUTINE W REFLEX MICROSCOPIC
Bilirubin Urine: NEGATIVE
Glucose, UA: >=500 mg/dL — AB
Hgb urine dipstick: NEGATIVE
Ketones, ur: 20 mg/dL — AB
Nitrite: NEGATIVE
Protein, ur: 100 mg/dL — AB
Specific Gravity, Urine: 1.024 (ref 1.005–1.030)
pH: 6 (ref 5.0–8.0)

## 2019-04-29 LAB — PROTEIN / CREATININE RATIO, URINE
Creatinine, Urine: 211.4 mg/dL
Protein Creatinine Ratio: 0.26 mg/mg{Cre} — ABNORMAL HIGH (ref 0.00–0.15)
Total Protein, Urine: 55 mg/dL

## 2019-04-29 LAB — CBC
HCT: 35.1 % — ABNORMAL LOW (ref 36.0–46.0)
Hemoglobin: 12 g/dL (ref 12.0–15.0)
MCH: 30.7 pg (ref 26.0–34.0)
MCHC: 34.2 g/dL (ref 30.0–36.0)
MCV: 89.8 fL (ref 80.0–100.0)
Platelets: 242 10*3/uL (ref 150–400)
RBC: 3.91 MIL/uL (ref 3.87–5.11)
RDW: 13.5 % (ref 11.5–15.5)
WBC: 11.8 10*3/uL — ABNORMAL HIGH (ref 4.0–10.5)
nRBC: 0 % (ref 0.0–0.2)

## 2019-04-29 LAB — SARS CORONAVIRUS 2 BY RT PCR (HOSPITAL ORDER, PERFORMED IN ~~LOC~~ HOSPITAL LAB): SARS Coronavirus 2: NEGATIVE

## 2019-04-29 MED ORDER — NEBIVOLOL HCL 5 MG PO TABS: 5.0000 mg | ORAL_TABLET | Freq: Every day | ORAL | 0 refills | Status: DC

## 2019-04-29 MED ORDER — IOHEXOL 350 MG/ML SOLN
75.0000 mL | Freq: Once | INTRAVENOUS | Status: AC | PRN
  Administered 2019-04-29: 75 mL via INTRAVENOUS

## 2019-04-29 MED ORDER — LABETALOL HCL 100 MG PO TABS
100.0000 mg | ORAL_TABLET | Freq: Once | ORAL | Status: AC
  Administered 2019-04-29: 100 mg via ORAL
  Filled 2019-04-29: qty 1

## 2019-04-29 NOTE — MAU Note (Signed)
Pt stated that for the past week she has gotten SOB with minimal activity. When she walks across her house she is very SOB and has to sit and rest frequently. Denies any cough or fever. Stated she  Also has bee getting numbness in her hands that works up to her shoulders sometimes. Denies headache dose get dizzy at times no visual change. Good fetal movement reported.

## 2019-04-29 NOTE — MAU Provider Note (Signed)
Chief Complaint:  Shortness of Breath   First Provider Initiated Contact with Patient 04/29/19 1349     HPI: Mary Hardy is a 38 y.o. [id number] at [redacted]w[redacted]d who presents to maternity admissions reporting SOB. Current symptoms started last week & have gradually worsened. Reports increasing SOB with minimal exertion. States she has chairs in every room of her house so she can sit down & rest when walking to different rooms. During these episodes she feels like her heart is racing & when she checks her pulse it's in the 150s. Denies cough, chest pain, fever/chills, calf pain, or hx of VTE.  She does have a hx of eclampsia in a previous pregnancy & is now chronic hypertensive. Currently taking BASA & labetalol 100 mg BID (missed this morning's dose). Denies headache, visual disturbance or epigastric pain.  Reports her husband was tested for COVID a few weeks ago d/t symptoms & recent travel to [place]. Covid negative but did have the flu.  She denies any recent travel or long car trips.  Denies contractions, leakage of fluid or vaginal bleeding. Good fetal movement.   Past Medical History:  Diagnosis Date  . Anemia   . Anxiety   . Chronic hypertension   . Depression   . Eclampsia (toxemia of pregnancy)   . History of eating disorder   . Incontinence of urine    due to nerve and muscle damage during child birth   OB History  Gravida Para Term Preterm AB Living  4 1   1 2 1   SAB TAB Ectopic Multiple Live Births  2       1    # Outcome Date GA Lbr Len/2nd Weight Sex Delivery Anes PTL Lv  4 Current           3 SAB 2017          2 Preterm 08/12/13 [redacted]w[redacted]d  2260 g F Vag-Spont EPI  LIV     Complications: Eclampsia  1 SAB            Past Surgical History:  Procedure Laterality Date  . GALLBLADDER SURGERY    . MOUTH SURGERY     dental  . WISDOM TOOTH EXTRACTION     Family History  Problem Relation Age of Onset  . Glaucoma Paternal Grandfather   . Heart attack Paternal Grandfather   .  Glaucoma Paternal Grandmother   . Heart attack Maternal Grandmother   . Heart disease Maternal Grandmother   . Diabetes Maternal Grandmother   . Heart attack Maternal Grandfather   . Heart disease Maternal Grandfather   . Carpal tunnel syndrome Mother   . Fibromyalgia Mother   . Heart disease Mother   . ADD / ADHD Daughter   . Cancer Maternal Aunt   . Heart attack Maternal Uncle    Social History   Tobacco Use  . Smoking status: Former Smoker    Types: E-cigarettes    Last attempt to quit: 03/21/2016    Years since quitting: 3.1  . Smokeless tobacco: Never Used  Substance Use Topics  . Alcohol use: Not Currently  . Drug use: Never   Allergies  Allergen Reactions  . Proanthocyanidin Anaphylaxis    Including grape juice, extracts, etc., reaction, throat gets scratchy, Pt gets short of breath, breaks out in rash also.   Medications Prior to Admission  Medication Sig Dispense Refill Last Dose  . aspirin EC 81 MG tablet Take 162 mg by mouth daily.   Past Month  at Unknown time  . Calcium Carbonate Antacid (TUMS PO) Take by mouth as needed.   04/28/2019 at Unknown time  . escitalopram (LEXAPRO) 10 MG tablet Take 1 tablet (10 mg total) by mouth daily. 30 tablet 6 Past Week at Unknown time  . labetalol (NORMODYNE) 100 MG tablet Take 1 tablet (100 mg total) by mouth 2 (two) times daily. 60 tablet 3 04/29/2019 at Unknown time  . prenatal vitamin w/FE, FA (PRENATAL 1 + 1) 27-1 MG TABS tablet Take 1 tablet by mouth daily at 12 noon. 30 each 11 04/29/2019 at Unknown time    I have reviewed patient's Past Medical Hx, Surgical Hx, Family Hx, Social Hx, medications and allergies.   ROS:  Review of Systems  Constitutional: Negative.   Respiratory: Positive for shortness of breath. Negative for cough and wheezing.   Cardiovascular: Negative for chest pain, palpitations and leg swelling.  Gastrointestinal: Negative.   Genitourinary: Negative.   Neurological: Negative.     Physical Exam    Patient Vitals for the past 24 hrs:  BP Temp Pulse Resp SpO2 Height Weight  04/29/19 1719 - - - - 98 % - -  04/29/19 1715 128/82 - (!) 108 - - - -  04/29/19 1714 - - - - 98 % - -  04/29/19 1704 - - - - 97 % - -  04/29/19 1703 132/80 - (!) 118 - - - -  04/29/19 1659 - - - - 96 % - -  04/29/19 1654 - - - - 97 % - -  04/29/19 1634 - - - - 98 % - -  04/29/19 1633 129/84 - (!) 108 - - - -  04/29/19 1545 (!) 135/93 - (!) 123 - - - -  04/29/19 1530 128/86 - (!) 116 - 96 % - -  04/29/19 1516 127/60 - (!) 119 - - - -  04/29/19 1514 - - - - 94 % - -  04/29/19 1511 130/79 - (!) 121 - - - -  04/29/19 1409 - - - - 94 % - -  04/29/19 1404 - - - - 95 % - -  04/29/19 1359 - - - - 95 % - -  04/29/19 1344 - - - - 96 % - -  04/29/19 1339 - - - - 96 % - -  04/29/19 1334 - - - - 95 % - -  04/29/19 1253 (!) 130/92 98.4 F (36.9 C) (!) 126 18 99 %  (1.575 m) 94.3 kg    Constitutional: Well-developed, well-nourished female in no acute distress.  Cardiovascular: Tachycardia. normal rhythm, no murmur Respiratory: normal effort, lung sounds clear throughout GI: Abd soft, non-tender, gravid appropriate for gestational age. Pos BS x 4 MS: Extremities nontender, no edema, normal ROM Neurologic: Alert and oriented x 4.   NST:  Baseline: 150 bpm, Variability: Good {> 6 bpm), Accelerations: Non-reactive but appropriate for gestational age and Decelerations: Absent   Labs: Results for orders placed or performed during the hospital encounter of 04/29/19 (from the past 24 hour(s))  CBC     Status: Abnormal   Collection Time: 04/29/19  2:10 PM  Result Value Ref Range   WBC 11.8 (H) 4.0 - 10.5 K/uL   RBC 3.91 3.87 - 5.11 MIL/uL   Hemoglobin 12.0 12.0 - 15.0 g/dL   HCT 16.1 (L) 09.6 - 04.5 %   MCV 89.8 80.0 - 100.0 fL   MCH 30.7 26.0 - 34.0 pg   MCHC 34.2 30.0 -  36.0 g/dL   RDW 16.113.5 09.611.5 - 04.515.5 %   Platelets 242 150 - 400 K/uL   nRBC 0.0 0.0 - 0.2 %  Comprehensive metabolic panel     Status:  Abnormal   Collection Time: 04/29/19  2:10 PM  Result Value Ref Range   Sodium 136 135 - 145 mmol/L   Potassium 4.2 3.5 - 5.1 mmol/L   Chloride 105 98 - 111 mmol/L   CO2 19 (L) 22 - 32 mmol/L   Glucose, Bld 162 (H) 70 - 99 mg/dL   BUN 6 6 - 20 mg/dL   Creatinine, Ser 4.090.81 0.44 - 1.00 mg/dL   Calcium 9.1 8.9 - 81.110.3 mg/dL   Total Protein 6.1 (L) 6.5 - 8.1 g/dL   Albumin 3.0 (L) 3.5 - 5.0 g/dL   AST 21 15 - 41 U/L   ALT 22 0 - 44 U/L   Alkaline Phosphatase 90 38 - 126 U/L   Total Bilirubin 0.5 0.3 - 1.2 mg/dL   GFR calc non Af Amer >60 >60 mL/min   GFR calc Af Amer >60 >60 mL/min   Anion gap 12 5 - 15  Protein / creatinine ratio, urine     Status: Abnormal   Collection Time: 04/29/19  2:10 PM  Result Value Ref Range   Creatinine, Urine 211.40 mg/dL   Total Protein, Urine 55 mg/dL   Protein Creatinine Ratio 0.26 (H) 0.00 - 0.15 mg/mg[Cre]  Urinalysis, Routine w reflex microscopic     Status: Abnormal   Collection Time: 04/29/19  2:10 PM  Result Value Ref Range   Color, Urine YELLOW YELLOW   APPearance CLOUDY (A) CLEAR   Specific Gravity, Urine 1.024 1.005 - 1.030   pH 6.0 5.0 - 8.0   Glucose, UA >=500 (A) NEGATIVE mg/dL   Hgb urine dipstick NEGATIVE NEGATIVE   Bilirubin Urine NEGATIVE NEGATIVE   Ketones, ur 20 (A) NEGATIVE mg/dL   Protein, ur 914100 (A) NEGATIVE mg/dL   Nitrite NEGATIVE NEGATIVE   Leukocytes,Ua LARGE (A) NEGATIVE   RBC / HPF 0-5 0 - 5 RBC/hpf   WBC, UA 21-50 0 - 5 WBC/hpf   Bacteria, UA MANY (A) NONE SEEN   Squamous Epithelial / LPF 21-50 0 - 5   Mucus PRESENT    Non Squamous Epithelial 0-5 (A) NONE SEEN  SARS Coronavirus 2 (CEPHEID- [facility]formed [facility] Hosp Order     Status: None   Collection Time: 04/29/19  2:10 PM  Result Value Ref Range   SARS Coronavirus 2 NEGATIVE NEGATIVE    Imaging:  Dg Chest 2 View  Result Date: 04/29/2019 CLINICAL DATA:  Shortness of breath EXAM: CHEST - 2 VIEW COMPARISON:  None. FINDINGS: Lungs are clear.  Heart size and pulmonary vascularity are normal. No adenopathy. No bone lesions. IMPRESSION: No edema or consolidation. Electronically Signed   By: Bretta BangWilliam  Woodruff III M.D.   On: 04/29/2019 14:38   Ct Angio Chest Pe W Or Wo Contrast  Result Date: 04/29/2019 CLINICAL DATA:  Shortness of breath with minimal activity. EXAM: CT ANGIOGRAPHY CHEST WITH CONTRAST TECHNIQUE: Multidetector CT imaging of the chest was performed using the standard protocol during bolus administration of intravenous contrast. Multiplanar CT image reconstructions and MIPs were obtained to evaluate the vascular anatomy. CONTRAST:  75mL OMNIPAQUE IOHEXOL 350 MG/ML SOLN COMPARISON:  Chest radiographs dated 04/29/2019. FINDINGS: Cardiovascular: Normally opacified pulmonary arteries with no pulmonary arterial filling defects seen. Normal sized heart. Mediastinum/Nodes: No enlarged mediastinal, hilar, or axillary  lymph nodes. Thyroid gland, trachea, and esophagus demonstrate no significant findings. Lungs/Pleura: Lungs are clear. No pleural effusion or pneumothorax. Upper Abdomen: Cholecystectomy clips. Musculoskeletal: Mild thoracic and lower cervical spine degenerative changes. Review of the MIP images confirms the above findings. IMPRESSION: No pulmonary emboli or acute abnormality. Electronically Signed   By: Beckie Salts M.D.   On: 04/29/2019 16:31    MAU Course: Orders Placed This Encounter  Procedures  . SARS Coronavirus 2 (CEPHEID- Performed in [facility] hospital lab), [facility]  . Culture, OB Urine  . DG Chest 2 View  . CT ANGIO CHEST PE W OR WO CONTRAST  . CBC  . Comprehensive metabolic panel  . Protein / creatinine ratio, urine  . Urinalysis, Routine w reflex microscopic  . Droplet and Contact precautions  . ED EKG  . Discharge patient   Meds ordered this encounter  Medications  . labetalol (NORMODYNE) tablet 100 mg  . iohexol (OMNIPAQUE) 350 MG/ML injection 75 mL  . nebivolol (BYSTOLIC) 5 MG tablet    Sig:  Take 1 tablet (5 mg total) by mouth daily.    Dispense:  30 tablet    Refill:  0    Order Specific Question:   Supervising Provider    Answer:   [place] [[id number]]    MDM: Mildly elevated BPs. SpO2 95-99%. Pt tachycardic with SOB.  PEC labs collected & reassuring Chest xray negative CT negative for PE EKG NSR Covid testing negative  Reviewed presentation, vital signs, labs, ekg, & imaging with Dr. Vergie Living. Symptoms possibly d/t labetalol since they started soon after initiating it. Will switch to bystolic 5 mg QD. If symptoms continue, recommend outpatient echo to be arranged by her ob/gyn [facility]). Pt has f/u appt with them on Monday.   Assessment: 1. Shortness of breath during pregnancy   2. SOB (shortness of breath)   3. [redacted] weeks gestation of pregnancy   4. Chronic hypertension affecting pregnancy     Plan: Discharge home in stable condition.  Discussed reasons to return to MAU vs ED, especially symptoms of PEC or worsening SOB.  D/c labetalol. Rx bystolic May need outpatient echo if symptoms persist   Allergies as of 04/29/2019      Reactions   Proanthocyanidin Anaphylaxis   Including grape juice, extracts, etc., reaction, throat gets scratchy, Pt gets short of breath, breaks out in rash also.      Medication List    STOP taking these medications   labetalol 100 MG tablet Commonly known as:  NORMODYNE     TAKE these medications   aspirin EC 81 MG tablet Take 162 mg by mouth daily.   escitalopram 10 MG tablet Commonly known as:  LEXAPRO Take 1 tablet (10 mg total) by mouth daily.   nebivolol 5 MG tablet Commonly known as:  Bystolic Take 1 tablet (5 mg total) by mouth daily.   prenatal vitamin w/FE, FA 27-1 MG Tabs tablet Take 1 tablet by mouth daily at 12 noon.   TUMS PO Take by mouth as needed.       Judeth Horn, NP 04/29/2019 5:46 PM

## 2019-04-29 NOTE — Discharge Instructions (Signed)
Hypertension During Pregnancy  Hypertension is also called high blood pressure. High blood pressure means that the force of your blood moving in your body is too strong. When you are pregnant, this condition should be watched carefully. It can cause problems for you and your baby. Follow these instructions at home: Eating and drinking   Drink enough fluid to keep your pee (urine) pale yellow.  Avoid caffeine. Lifestyle  Do not use any products that contain nicotine or tobacco, such as cigarettes and e-cigarettes. If you need help quitting, ask your doctor.  Do not use alcohol or drugs.  Avoid stress.  Rest and get plenty of sleep. General instructions  Take over-the-counter and prescription medicines only as told by your doctor.  While lying down, lie on your left side. This keeps pressure off your major blood vessels.  While sitting or lying down, raise (elevate) your feet. Try putting some pillows under your lower legs.  Exercise regularly. Ask your doctor what kinds of exercise are best for you.  Keep all prenatal and follow-up visits as told by your doctor. This is important. Contact a doctor if:  You have symptoms that your doctor told you to watch for, such as: ? Throwing up (vomiting). ? Feeling sick to your stomach (nausea). ? Headache. Get help right away if you have:  Very bad belly pain that does not get better with treatment.  A very bad headache that does not get better.  Throwing up that does not get better with treatment.  Sudden, fast weight gain.  Sudden swelling in your hands, ankles, or face.  Bleeding from your vagina.  Blood in your pee.  Fewer movements from your baby than usual.  Blurry vision.  Double vision.  Muscle twitching.  Sudden muscle tightening (spasms).  Trouble breathing.  Blue fingernails or lips. Summary  Hypertension is also called high blood pressure. High blood pressure means that the force of your blood moving  in your body is too strong.  When you are pregnant, this condition should be watched carefully. It can cause problems for you and your baby.  Get help right away if you have symptoms that your doctor told you to watch for. This information is not intended to replace advice given to you by your health care provider. Make sure you discuss any questions you have with your health care provider. Document Released: 12/13/2010 Document Revised: 10/27/2017 Document Reviewed: 07/22/2016 Elsevier Interactive Patient Education  2019 [employer]  Shortness of Breath, Adult Shortness of breath means you have trouble breathing. Shortness of breath could be a sign of a medical problem. Follow these instructions at home:   Watch for any changes in your symptoms.  Do not use any products that contain nicotine or tobacco, such as cigarettes, e-cigarettes, and chewing tobacco.  Do not smoke. Smoking can cause shortness of breath. If you need help to quit smoking, ask your doctor.  Avoid things that can make it harder to breathe, such as: ? Mold. ? Dust. ? Air pollution. ? Chemical smells. ? Things that can cause allergy symptoms (allergens), if you have allergies.  Keep your living space clean. Use products that help remove mold and dust.  Rest as needed. Slowly return to your normal activities.  Take over-the-counter and prescription medicines only as told by your doctor. This includes oxygen therapy and inhaled medicines.  Keep all follow-up visits as told by your doctor. This is important. Contact a doctor if:  Your condition does not get  better as soon as expected.  You have a hard time doing your normal activities, even after you rest.  You have new symptoms. Get help right away if:  Your shortness of breath gets worse.  You have trouble breathing when you are resting.  You feel light-headed or you pass out (faint).  You have a cough that is not helped by medicines.  You cough  up blood.  You have pain with breathing.  You have pain in your chest, arms, shoulders, or belly (abdomen).  You have a fever.  You cannot walk up stairs.  You cannot exercise the way you normally do. These symptoms may represent a serious problem that is an emergency. Do not wait to see if the symptoms will go away. Get medical help right away. Call your local emergency services (911 in the [place] Do not drive yourself to the hospital. Summary  Shortness of breath is when you have trouble breathing enough air. It can be a sign of a medical problem.  Avoid things that make it hard for you to breathe, such as smoking, pollution, mold, and dust.  Watch for any changes in your symptoms. Contact your doctor if you do not get better or you get worse. This information is not intended to replace advice given to you by your health care provider. Make sure you discuss any questions you have with your health care provider. Document Released: 04/28/2008 Document Revised: 04/12/2018 Document Reviewed: 04/12/2018 Elsevier Interactive Patient Education  2019 [employer].

## 2019-04-29 NOTE — Telephone Encounter (Signed)
Patient states for the last couple of days she has had SOB just with walking from her living room to her kitchen, "woozy feeling" at times, and hands seem to go numb up to her forearm which sometimes starts in left hand then goes to her right.  BP have been normal but she does notice that her HR goes up to 150's when she feels this way.  Patient states she seems to be getting progressively getting worse.  She has not had a fever, body aches or chills. Discussed with Dr Despina Hidden and advised patient needs to go to [facility] for eval.  Patient verbalized understanding.

## 2019-04-30 LAB — CULTURE, OB URINE: Culture: >=100000 — AB

## 2019-05-02 ENCOUNTER — Other Ambulatory Visit: Payer: Self-pay

## 2019-05-02 ENCOUNTER — Ambulatory Visit (INDEPENDENT_AMBULATORY_CARE_PROVIDER_SITE_OTHER): Payer: No Typology Code available for payment source

## 2019-05-02 ENCOUNTER — Ambulatory Visit (INDEPENDENT_AMBULATORY_CARE_PROVIDER_SITE_OTHER): Payer: No Typology Code available for payment source | Admitting: Women's Health

## 2019-05-02 ENCOUNTER — Encounter: Payer: Self-pay | Admitting: Women's Health

## 2019-05-02 VITALS — BP 125/88 | HR 104 | Temp 99.0°F | Wt 208.0 lb

## 2019-05-02 DIAGNOSIS — Z331 Pregnant state, incidental: Secondary | ICD-10-CM

## 2019-05-02 DIAGNOSIS — O10919 Unspecified pre-existing hypertension complicating pregnancy, unspecified trimester: Secondary | ICD-10-CM

## 2019-05-02 DIAGNOSIS — Z3A26 26 weeks gestation of pregnancy: Secondary | ICD-10-CM

## 2019-05-02 DIAGNOSIS — R82998 Other abnormal findings in urine: Secondary | ICD-10-CM

## 2019-05-02 DIAGNOSIS — O0992 Supervision of high risk pregnancy, unspecified, second trimester: Secondary | ICD-10-CM

## 2019-05-02 DIAGNOSIS — O10912 Unspecified pre-existing hypertension complicating pregnancy, second trimester: Secondary | ICD-10-CM

## 2019-05-02 DIAGNOSIS — Z362 Encounter for other antenatal screening follow-up: Secondary | ICD-10-CM | POA: Diagnosis not present

## 2019-05-02 DIAGNOSIS — Z1389 Encounter for screening for other disorder: Secondary | ICD-10-CM

## 2019-05-02 LAB — POCT URINALYSIS DIPSTICK OB
Glucose, UA: NEGATIVE
Ketones, UA: NEGATIVE
Nitrite, UA: NEGATIVE

## 2019-05-02 NOTE — Patient Instructions (Addendum)
Mary BaxterErin Hardy, I greatly value your feedback.  If you receive a survey following your visit with us today, we appreciate you taking the time to fill it out.  Thanks, Mary HaffKim Yaviel Hardy, CNM, WHNP-BC   You will have your sugar test next visit.  Please do not eat or drink anything after midnight the night before you come, not even water.  You will be here for at least two hours.     [facility] now [facility] ([address] [place], [place] [id number]) Entrance located off of [address] Free 24/7 valet parking  Home Blood Pressure Monitoring for Patients   Check your blood pressure 4 times daily and keep a log, bring this with you to all appointments.  If blood pressure is >=160 on top or >=110 on bottom, check again in 5 minutes, if still this high, call us or go to [facility] to be evaluated   Helpful Tips for Accurate Home Blood Pressure Checks  . Don't smoke, exercise, or drink caffeine 30 minutes before checking your BP . Use the restroom before checking your BP (a full bladder can raise your pressure) . Relax in a comfortable upright chair . Feet on the ground . Left arm resting comfortably on a flat surface at the level of your heart . Legs uncrossed . Back supported . Sit quietly and don't talk . Place the cuff on your bare arm . Adjust snuggly, so that only two fingertips can fit between your skin and the top of the cuff . Check 2 readings separated by at least one minute . Keep a log of your BP readings . For a visual, please reference this diagram: http://ccnc.care/bpdiagram  Provider Name: [facility]     Phone: [phone]  Zone 1: ALL CLEAR  Continue to monitor your symptoms:  . BP reading is less than 140 (top number) or less than 90 (bottom number)  . No right upper stomach pain . No headaches or seeing spots . No feeling nauseated or throwing up . No swelling in face and hands  Zone 2: CAUTION Call  your doctor's office for any of the following:  . BP reading is greater than 140 (top number) or greater than 90 (bottom number)  . Stomach pain under your ribs in the middle or right side . Headaches or seeing spots . Feeling nauseated or throwing up . Swelling in face and hands  Zone 3: EMERGENCY  Seek immediate medical care if you have any of the following:  . BP reading is greater than160 (top number) or greater than 110 (bottom number) . Severe headaches not improving with Tylenol . Serious difficulty catching your breath . Any worsening symptoms from Zone 2   Call the office [phone]) or go to [facility] if:  You begin to have strong, frequent contractions  Your water breaks.  Sometimes it is a big gush of fluid, sometimes it is just a trickle that keeps getting your panties wet or running down your legs  You have vaginal bleeding.  It is normal to have a small amount of spotting if your cervix was checked.   You don't feel your baby moving like normal.  If you don't, get you something to eat and drink and lay down and focus on feeling your baby move.   If your baby is still not moving like normal, you should call the office or go to [facility].  Call the office [phone]) or go to [facility] hospital for these signs of pre-eclampsia:  Severe headache that does not go away with Tylenol  Visual changes- seeing spots, double, blurred vision  Pain under your right breast or upper abdomen that does not go away with Tums or heartburn medicine  Nausea and/or vomiting  Severe swelling in your hands, feet, and face      Second Trimester of Pregnancy The second trimester is from week 13 through week 28, months 4 through 6. The second trimester is often a time when you feel your best. Your body has also adjusted to being pregnant, and you begin to feel better physically. Usually, morning sickness has lessened or quit completely, you may have more energy, and you may have  an increase in appetite. The second trimester is also a time when the fetus is growing rapidly. At the end of the sixth month, the fetus is about 9 inches long and weighs about 1 pounds. You will likely begin to feel the baby move (quickening) between 18 and 20 weeks of the pregnancy. BODY CHANGES Your body goes through many changes during pregnancy. The changes vary from woman to woman.   Your weight will continue to increase. You will notice your lower abdomen bulging out.  You may begin to get stretch marks on your hips, abdomen, and breasts.  You may develop headaches that can be relieved by medicines approved by your health care provider.  You may urinate more often because the fetus is pressing on your bladder.  You may develop or continue to have heartburn as a result of your pregnancy.  You may develop constipation because certain hormones are causing the muscles that push waste through your intestines to slow down.  You may develop hemorrhoids or swollen, bulging veins (varicose veins).  You may have back pain because of the weight gain and pregnancy hormones relaxing your joints between the bones in your pelvis and as a result of a shift in weight and the muscles that support your balance.  Your breasts will continue to grow and be tender.  Your gums may bleed and may be sensitive to brushing and flossing.  Dark spots or blotches (chloasma, mask of pregnancy) may develop on your face. This will likely fade after the baby is born.  A dark line from your belly button to the pubic area (linea nigra) may appear. This will likely fade after the baby is born.  You may have changes in your hair. These can include thickening of your hair, rapid growth, and changes in texture. Some women also have hair loss during or after pregnancy, or hair that feels dry or thin. Your hair will most likely return to normal after your baby is born. WHAT TO EXPECT AT YOUR PRENATAL VISITS During a  routine prenatal visit:  You will be weighed to make sure you and the fetus are growing normally.  Your blood pressure will be taken.  Your abdomen will be measured to track your baby's growth.  The fetal heartbeat will be listened to.  Any test results from the previous visit will be discussed. Your health care provider may ask you:  How you are feeling.  If you are feeling the baby move.  If you have had any abnormal symptoms, such as leaking fluid, bleeding, severe headaches, or abdominal cramping.  If you have any questions. Other tests that may be performed during your second trimester include:  Blood tests that check for:  Low  iron levels (anemia).  Gestational diabetes (between 24 and 28 weeks).  Rh antibodies.  Urine tests to check for infections, diabetes, or protein in the urine.  An ultrasound to confirm the proper growth and development of the baby.  An amniocentesis to check for possible genetic problems.  Fetal screens for spina bifida and Down syndrome. HOME CARE INSTRUCTIONS   Avoid all smoking, herbs, alcohol, and unprescribed drugs. These chemicals affect the formation and growth of the baby.  Follow your health care provider's instructions regarding medicine use. There are medicines that are either safe or unsafe to take during pregnancy.  Exercise only as directed by your health care provider. Experiencing uterine cramps is a good sign to stop exercising.  Continue to eat regular, healthy meals.  Wear a good support bra for breast tenderness.  Do not use hot tubs, steam rooms, or saunas.  Wear your seat belt at all times when driving.  Avoid raw meat, uncooked cheese, cat litter boxes, and soil used by cats. These carry germs that can cause birth defects in the baby.  Take your prenatal vitamins.  Try taking a stool softener (if your health care provider approves) if you develop constipation. Eat more high-fiber foods, such as fresh  vegetables or fruit and whole grains. Drink plenty of fluids to keep your urine clear or pale yellow.  Take warm sitz baths to soothe any pain or discomfort caused by hemorrhoids. Use hemorrhoid cream if your health care provider approves.  If you develop varicose veins, wear support hose. Elevate your feet for 15 minutes, 3-4 times a day. Limit salt in your diet.  Avoid heavy lifting, wear low heel shoes, and practice good posture.  Rest with your legs elevated if you have leg cramps or low back pain.  Visit your dentist if you have not gone yet during your pregnancy. Use a soft toothbrush to brush your teeth and be gentle when you floss.  A sexual relationship may be continued unless your health care provider directs you otherwise.  Continue to go to all your prenatal visits as directed by your health care provider. SEEK MEDICAL CARE IF:   You have dizziness.  You have mild pelvic cramps, pelvic pressure, or nagging pain in the abdominal area.  You have persistent nausea, vomiting, or diarrhea.  You have a bad smelling vaginal discharge.  You have pain with urination. SEEK IMMEDIATE MEDICAL CARE IF:   You have a fever.  You are leaking fluid from your vagina.  You have spotting or bleeding from your vagina.  You have severe abdominal cramping or pain.  You have rapid weight gain or loss.  You have shortness of breath with chest pain.  You notice sudden or extreme swelling of your face, hands, ankles, feet, or legs.  You have not felt your baby move in over an hour.  You have severe headaches that do not go away with medicine.  You have vision changes. Document Released: 11/04/2001 Document Revised: 11/15/2013 Document Reviewed: 01/11/2013 [facility] Patient Information 2015 [employer] [place]. This information is not intended to replace advice given to you by your health care provider. Make sure you discuss any questions you have with your health care provider.

## 2019-05-02 NOTE — Progress Notes (Signed)
HIGH-RISK PREGNANCY VISIT Patient name: Mary Hardy MRN [id number]  Date of birth: Mar 23, 1981 Chief Complaint:   High Risk Gestation ([place] today)  History of Present Illness:   Mary Hardy is a 38 y.o. [phone] female at [redacted]w[redacted]d with an Estimated Date of Delivery: 08/02/19 being seen today for ongoing management of a high-risk pregnancy complicated by [facility] on bystolic 5mg , h/o eclampsia w/ 34wk PTB.  Today she reports no complaints. Went to [facility] 6/5 w/ SOB, neg work-up, had started Labetalol 100mg  BID on 5/18, thought it may be r/t this, so switched to bystolic 5mg  daily, has felt much better since, and home bp's have been normal. Contractions: Irregular. Vag. Bleeding: None.  Movement: Present. denies leaking of fluid.  Review of Systems:   Pertinent items are noted in HPI Denies abnormal vaginal discharge w/ itching/odor/irritation, headaches, visual changes, shortness of breath, chest pain, abdominal pain, severe nausea/vomiting, or problems with urination or bowel movements unless otherwise stated above. Pertinent History Reviewed:  Reviewed past medical,surgical, social, obstetrical and family history.  Reviewed problem list, medications and allergies. Physical Assessment:   Vitals:   05/02/19 1615  BP: 125/88  Pulse: (!) 104  Temp: 99 F (37.2 C)  Weight: 208 lb (94.3 kg)  Body mass index is 38.04 kg/m.           Physical Examination:   General appearance: alert, well appearing, and in no distress  Mental status: alert, oriented to person, place, and time  Skin: warm & dry   Extremities: Edema: None    Cardiovascular: normal heart rate noted  Respiratory: normal respiratory effort, no distress  Abdomen: gravid, soft, non-tender  Pelvic: Cervical exam deferred         Fetal Status: Fetal Heart Rate (bpm): 140 u/s   Movement: Present    Fetal Surveillance Testing today: [place] 26+6 wks,breech,cx 3.7 cm,anterior placenta gr 3,normal ovaries bilat,afi 15 cm,fhr 140 bpm,EFW 1062 g  Results for orders placed or performed in visit on 05/02/19 (from the past 24 hour(s))  POC Urinalysis Dipstick OB   Collection Time: 05/02/19  4:16 PM  Result Value Ref Range   Color, UA     Clarity, UA     Glucose, UA Negative Negative   Bilirubin, UA     Ketones, UA neg    Spec Grav, UA     Blood, UA trace    pH, UA     POC,PROTEIN,UA Trace Negative, Trace, Small (1+), Moderate (2+), Large (3+), 4+   Urobilinogen, UA     Nitrite, UA neg    Leukocytes, UA Moderate (2+) (A) Negative   Appearance     Odor      Assessment & Plan:  1) High-risk pregnancy [id number] at [redacted]w[redacted]d with an Estimated Date of Delivery: 08/02/19   2) CHTN, stable on bystolic 5mg  daily, ASA   3) H/O eclampsia w/ 34wk PTB  Meds: No orders of the defined types were placed in this encounter.  Labs/procedures today: urine cx, u/s  Treatment Plan:  Growth u/s q4wks    2x/wk testing nst/sono @ 32wks or weekly BPP    Deliver 39wks (37wks if poor control)____   Reviewed: Preterm labor symptoms and general obstetric precautions including but not limited to vaginal bleeding, contractions, leaking of fluid and fetal movement were reviewed in detail with the patient.  All questions were answered.  Follow-up: Return in about 2 weeks (around 05/16/2019) for HROB in office, PN2; then 2wks later for hrob in office w/ EFW  u/s.  Orders Placed This Encounter  Procedures  . Urine Culture  . POC Urinalysis Dipstick OB   Cheral MarkerKimberly R Kimble Delaurentis CNM, [facility] 05/02/2019 4:49 PM

## 2019-05-02 NOTE — Progress Notes (Signed)
[place] 26+6 wks,breech,cx 3.7 cm,anterior placenta gr 3,normal ovaries bilat,afi 15 cm,fhr 140 bpm,EFW 1062 g

## 2019-05-05 LAB — URINE CULTURE

## 2019-05-17 ENCOUNTER — Encounter: Payer: Self-pay | Admitting: Women's Health

## 2019-05-17 ENCOUNTER — Other Ambulatory Visit: Payer: No Typology Code available for payment source

## 2019-05-17 ENCOUNTER — Other Ambulatory Visit: Payer: Self-pay

## 2019-05-17 ENCOUNTER — Ambulatory Visit (INDEPENDENT_AMBULATORY_CARE_PROVIDER_SITE_OTHER): Payer: No Typology Code available for payment source | Admitting: Women's Health

## 2019-05-17 VITALS — BP 118/77 | HR 108 | Wt 210.0 lb

## 2019-05-17 DIAGNOSIS — O0992 Supervision of high risk pregnancy, unspecified, second trimester: Secondary | ICD-10-CM

## 2019-05-17 DIAGNOSIS — Z3A28 28 weeks gestation of pregnancy: Secondary | ICD-10-CM

## 2019-05-17 DIAGNOSIS — Z331 Pregnant state, incidental: Secondary | ICD-10-CM

## 2019-05-17 DIAGNOSIS — Z131 Encounter for screening for diabetes mellitus: Secondary | ICD-10-CM

## 2019-05-17 DIAGNOSIS — Z23 Encounter for immunization: Secondary | ICD-10-CM | POA: Diagnosis not present

## 2019-05-17 DIAGNOSIS — O10919 Unspecified pre-existing hypertension complicating pregnancy, unspecified trimester: Secondary | ICD-10-CM

## 2019-05-17 DIAGNOSIS — O10913 Unspecified pre-existing hypertension complicating pregnancy, third trimester: Secondary | ICD-10-CM

## 2019-05-17 DIAGNOSIS — Z1389 Encounter for screening for other disorder: Secondary | ICD-10-CM

## 2019-05-17 DIAGNOSIS — O0993 Supervision of high risk pregnancy, unspecified, third trimester: Secondary | ICD-10-CM

## 2019-05-17 DIAGNOSIS — Z3A29 29 weeks gestation of pregnancy: Secondary | ICD-10-CM

## 2019-05-17 LAB — POCT URINALYSIS DIPSTICK OB
Ketones, UA: NEGATIVE
Nitrite, UA: NEGATIVE
POC,PROTEIN,UA: NEGATIVE

## 2019-05-17 NOTE — Progress Notes (Signed)
   HIGH-RISK PREGNANCY VISIT Patient name: Mary Hardy MRN [id number]  Date of birth: 1981/09/07 Chief Complaint:   Routine Prenatal Visit (PN2)  History of Present Illness:   Mary Hardy is a 38 y.o. [phone] female at [redacted]w[redacted]d with an Estimated Date of Delivery: 08/02/19 being seen today for ongoing management of a high-risk pregnancy complicated by [facility] on bystolic 5mg  daily, h/o eclampsia w/ 34wk PTB and pp pre-e.  Today she reports no complaints. Contractions: Irregular. Vag. Bleeding: None.  Movement: Present. denies leaking of fluid.  Review of Systems:   Pertinent items are noted in HPI Denies abnormal vaginal discharge w/ itching/odor/irritation, headaches, visual changes, shortness of breath, chest pain, abdominal pain, severe nausea/vomiting, or problems with urination or bowel movements unless otherwise stated above. Pertinent History Reviewed:  Reviewed past medical,surgical, social, obstetrical and family history.  Reviewed problem list, medications and allergies. Physical Assessment:   Vitals:   05/17/19 0923  BP: 118/77  Pulse: (!) 108  Weight: 210 lb (95.3 kg)  Body mass index is 38.41 kg/m.           Physical Examination:   General appearance: alert, well appearing, and in no distress  Mental status: alert, oriented to person, place, and time  Skin: warm & dry   Extremities: Edema: None    Cardiovascular: normal heart rate noted  Respiratory: normal respiratory effort, no distress  Abdomen: gravid, soft, non-tender  Pelvic: Cervical exam deferred         Fetal Status: Fetal Heart Rate (bpm): 149 Fundal Height: 30 cm Movement: Present    Fetal Surveillance Testing today: doppler   Results for orders placed or performed in visit on 05/17/19 (from the past 24 hour(s))  POC Urinalysis Dipstick OB   Collection Time: 05/17/19  9:31 AM  Result Value Ref Range   Color, UA     Clarity, UA     Glucose, UA Moderate (2+) (A) Negative   Bilirubin, UA     Ketones, UA  neg    Spec Grav, UA     Blood, UA moderate    pH, UA     POC,PROTEIN,UA Negative Negative, Trace, Small (1+), Moderate (2+), Large (3+), 4+   Urobilinogen, UA     Nitrite, UA neg    Leukocytes, UA Trace (A) Negative   Appearance     Odor      Assessment & Plan:  1) High-risk pregnancy [id number] at [redacted]w[redacted]d with an Estimated Date of Delivery: 08/02/19   2) CHTN, stable on bystolic 5mg  daily, ASA, last EFW 58% at 26.6wks  3) H/O eclampsia & pp pre-e> continue ASA, reviewed warning s/s, reasons to seek care  Meds: No orders of the defined types were placed in this encounter.  Labs/procedures today: pn2, tdap  Treatment Plan:  Growth u/s q4wks    2x/wk testing nst/sono @ 32wks or weekly BPP    Deliver 39wks (37wks if poor control)  Reviewed: Preterm labor symptoms and general obstetric precautions including but not limited to vaginal bleeding, contractions, leaking of fluid and fetal movement were reviewed in detail with the patient.  All questions were answered.  Follow-up: Return for As scheduled 7/2 for efw u/s and hrob in person.  Orders Placed This Encounter  Procedures  . Tdap vaccine greater than or equal to 7yo IM  . POC Urinalysis Dipstick OB   Roma Schanz CNM, [facility] 05/17/2019 10:05 AM

## 2019-05-17 NOTE — Patient Instructions (Addendum)
Mary BaxterErin Hardy, I greatly value your feedback.  If you receive a survey following your visit with Mary Hardy today, we appreciate you taking the time to fill it out.  Thanks, Mary HaffKim Samiksha Hardy, CNM, [facility]NP[facility]BC  [facility] now [facility] ([address] [place], [place] [address]) Entrance located off of [address] Free 24/7 valet parking    Call the office [phone]) or go to [facility] if:  You begin to have strong, frequent contractions  Your water breaks.  Sometimes it is a big gush of fluid, sometimes it is just a trickle that keeps getting your panties wet or running down your legs  You have vaginal bleeding.  It is normal to have a small amount of spotting if your cervix was checked.   You don't feel your baby moving like normal.  If you don't, get you something to eat and drink and lay down and focus on feeling your baby move.  You should feel at least 10 movements in 2 hours.  If you don't, you should call the office or go to [facility].    Tdap Vaccine  It is recommended that you get the Tdap vaccine during the third trimester of EACH pregnancy to help protect your baby from getting pertussis (whooping cough)  27-36 weeks is the BEST time to do this so that you can pass the protection on to your baby. During pregnancy is better than after pregnancy, but if you are unable to get it during pregnancy it will be offered at the hospital.   You can get this vaccine with Mary Hardy, at the health department, your family doctor, or some local pharmacies  Everyone who will be around your baby should also be up-to-date on their vaccines before the baby comes. Adults (who are not pregnant) only need 1 dose of Tdap during adulthood.   Call the office [phone]) or go to [facility] for these signs of pre-eclampsia:  Severe headache that does not go away with Tylenol  Visual changes- seeing spots, double, blurred vision  Pain under your  right breast or upper abdomen that does not go away with Tums or heartburn medicine  Nausea and/or vomiting  Severe swelling in your hands, feet, and face      Third Trimester of Pregnancy The third trimester is from week 29 through week 42, months 7 through 9. The third trimester is a time when the fetus is growing rapidly. At the end of the ninth month, the fetus is about 20 inches in length and weighs 6-10 pounds.  BODY CHANGES Your body goes through many changes during pregnancy. The changes vary from woman to woman.   Your weight will continue to increase. You can expect to gain 25-35 pounds (11-16 kg) by the end of the pregnancy.  You may begin to get stretch marks on your hips, abdomen, and breasts.  You may urinate more often because the fetus is moving lower into your pelvis and pressing on your bladder.  You may develop or continue to have heartburn as a result of your pregnancy.  You may develop constipation because certain hormones are causing the muscles that push waste through your intestines to slow down.  You may develop hemorrhoids or swollen, bulging veins (varicose veins).  You may have pelvic pain because of the weight gain and pregnancy hormones relaxing your joints between the bones in your pelvis. Backaches may result from overexertion of the muscles supporting  your posture.  You may have changes in your hair. These can include thickening of your hair, rapid growth, and changes in texture. Some women also have hair loss during or after pregnancy, or hair that feels dry or thin. Your hair will most likely return to normal after your baby is born.  Your breasts will continue to grow and be tender. A yellow discharge may leak from your breasts called colostrum.  Your belly button may stick out.  You may feel short of breath because of your expanding uterus.  You may notice the fetus "dropping," or moving lower in your abdomen.  You may have a bloody mucus  discharge. This usually occurs a few days to a week before labor begins.  Your cervix becomes thin and soft (effaced) near your due date. WHAT TO EXPECT AT YOUR PRENATAL EXAMS  You will have prenatal exams every 2 weeks until week 36. Then, you will have weekly prenatal exams. During a routine prenatal visit:  You will be weighed to make sure you and the fetus are growing normally.  Your blood pressure is taken.  Your abdomen will be measured to track your baby's growth.  The fetal heartbeat will be listened to.  Any test results from the previous visit will be discussed.  You may have a cervical check near your due date to see if you have effaced. At around 36 weeks, your caregiver will check your cervix. At the same time, your caregiver will also perform a test on the secretions of the vaginal tissue. This test is to determine if a type of bacteria, Group B streptococcus, is present. Your caregiver will explain this further. Your caregiver may ask you:  What your birth plan is.  How you are feeling.  If you are feeling the baby move.  If you have had any abnormal symptoms, such as leaking fluid, bleeding, severe headaches, or abdominal cramping.  If you have any questions. Other tests or screenings that may be performed during your third trimester include:  Blood tests that check for low iron levels (anemia).  Fetal testing to check the health, activity level, and growth of the fetus. Testing is done if you have certain medical conditions or if there are problems during the pregnancy. FALSE LABOR You may feel small, irregular contractions that eventually go away. These are called Braxton Hicks contractions, or false labor. Contractions may last for hours, days, or even weeks before true labor sets in. If contractions come at regular intervals, intensify, or become painful, it is best to be seen by your caregiver.  SIGNS OF LABOR   Menstrual-like cramps.  Contractions that  are 5 minutes apart or less.  Contractions that start on the top of the uterus and spread down to the lower abdomen and back.  A sense of increased pelvic pressure or back pain.  A watery or bloody mucus discharge that comes from the vagina. If you have any of these signs before the 37th week of pregnancy, call your caregiver right away. You need to go to the hospital to get checked immediately. HOME CARE INSTRUCTIONS   Avoid all smoking, herbs, alcohol, and unprescribed drugs. These chemicals affect the formation and growth of the baby.  Follow your caregiver's instructions regarding medicine use. There are medicines that are either safe or unsafe to take during pregnancy.  Exercise only as directed by your caregiver. Experiencing uterine cramps is a good sign to stop exercising.  Continue to eat regular, healthy meals.  Wear a good support bra for breast tenderness.  Do not use hot tubs, steam rooms, or saunas.  Wear your seat belt at all times when driving.  Avoid raw meat, uncooked cheese, cat litter boxes, and soil used by cats. These carry germs that can cause birth defects in the baby.  Take your prenatal vitamins.  Try taking a stool softener (if your caregiver approves) if you develop constipation. Eat more high-fiber foods, such as fresh vegetables or fruit and whole grains. Drink plenty of fluids to keep your urine clear or pale yellow.  Take warm sitz baths to soothe any pain or discomfort caused by hemorrhoids. Use hemorrhoid cream if your caregiver approves.  If you develop varicose veins, wear support hose. Elevate your feet for 15 minutes, 3-4 times a day. Limit salt in your diet.  Avoid heavy lifting, wear low heal shoes, and practice good posture.  Rest a lot with your legs elevated if you have leg cramps or low back pain.  Visit your dentist if you have not gone during your pregnancy. Use a soft toothbrush to brush your teeth and be gentle when you floss.  A  sexual relationship may be continued unless your caregiver directs you otherwise.  Do not travel far distances unless it is absolutely necessary and only with the approval of your caregiver.  Take prenatal classes to understand, practice, and ask questions about the labor and delivery.  Make a trial run to the hospital.  Pack your hospital bag.  Prepare the baby's nursery.  Continue to go to all your prenatal visits as directed by your caregiver. SEEK MEDICAL CARE IF:  You are unsure if you are in labor or if your water has broken.  You have dizziness.  You have mild pelvic cramps, pelvic pressure, or nagging pain in your abdominal area.  You have persistent nausea, vomiting, or diarrhea.  You have a bad smelling vaginal discharge.  You have pain with urination. SEEK IMMEDIATE MEDICAL CARE IF:   You have a fever.  You are leaking fluid from your vagina.  You have spotting or bleeding from your vagina.  You have severe abdominal cramping or pain.  You have rapid weight loss or gain.  You have shortness of breath with chest pain.  You notice sudden or extreme swelling of your face, hands, ankles, feet, or legs.  You have not felt your baby move in over an hour.  You have severe headaches that do not go away with medicine.  You have vision changes. Document Released: 11/04/2001 Document Revised: 11/15/2013 Document Reviewed: 01/11/2013 [facility] Patient Information 2015 [employer] [place]. This information is not intended to replace advice given to you by your health care provider. Make sure you discuss any questions you have with your health care provider.

## 2019-05-18 ENCOUNTER — Encounter: Payer: Self-pay | Admitting: Women's Health

## 2019-05-18 DIAGNOSIS — E119 Type 2 diabetes mellitus without complications: Secondary | ICD-10-CM | POA: Insufficient documentation

## 2019-05-18 LAB — CBC
Hematocrit: 33.7 % — ABNORMAL LOW (ref 34.0–46.6)
Hemoglobin: 11.6 g/dL (ref 11.1–15.9)
MCH: 29.9 pg (ref 26.6–33.0)
MCHC: 34.4 g/dL (ref 31.5–35.7)
MCV: 87 fL (ref 79–97)
Platelets: 236 10*3/uL (ref 150–450)
RBC: 3.88 x10E6/uL (ref 3.77–5.28)
RDW: 13.2 % (ref 11.7–15.4)
WBC: 12.9 10*3/uL — ABNORMAL HIGH (ref 3.4–10.8)

## 2019-05-18 LAB — RPR: RPR Ser Ql: NONREACTIVE

## 2019-05-18 LAB — HIV ANTIBODY (ROUTINE TESTING W REFLEX): HIV Screen 4th Generation wRfx: NONREACTIVE

## 2019-05-18 LAB — GLUCOSE TOLERANCE, 2 HOURS W/ 1HR
Glucose, 1 hour: 255 mg/dL — ABNORMAL HIGH (ref 65–179)
Glucose, 2 hour: 204 mg/dL — ABNORMAL HIGH (ref 65–152)
Glucose, Fasting: 126 mg/dL — ABNORMAL HIGH (ref 65–91)

## 2019-05-18 LAB — ANTIBODY SCREEN: Antibody Screen: NEGATIVE

## 2019-05-19 ENCOUNTER — Encounter: Payer: Self-pay | Admitting: *Deleted

## 2019-05-20 ENCOUNTER — Other Ambulatory Visit: Payer: Self-pay | Admitting: *Deleted

## 2019-05-20 DIAGNOSIS — O24419 Gestational diabetes mellitus in pregnancy, unspecified control: Secondary | ICD-10-CM

## 2019-05-20 MED ORDER — BLOOD GLUCOSE METER KIT: PACK | 0 refills | Status: DC

## 2019-05-26 ENCOUNTER — Ambulatory Visit (INDEPENDENT_AMBULATORY_CARE_PROVIDER_SITE_OTHER): Payer: No Typology Code available for payment source

## 2019-05-26 ENCOUNTER — Encounter: Payer: Self-pay | Admitting: Advanced Practice Midwife

## 2019-05-26 ENCOUNTER — Other Ambulatory Visit: Payer: Self-pay

## 2019-05-26 ENCOUNTER — Ambulatory Visit (INDEPENDENT_AMBULATORY_CARE_PROVIDER_SITE_OTHER): Payer: No Typology Code available for payment source | Admitting: Advanced Practice Midwife

## 2019-05-26 VITALS — BP 126/84 | HR 108 | Wt 212.0 lb

## 2019-05-26 DIAGNOSIS — O24419 Gestational diabetes mellitus in pregnancy, unspecified control: Secondary | ICD-10-CM

## 2019-05-26 DIAGNOSIS — O0992 Supervision of high risk pregnancy, unspecified, second trimester: Secondary | ICD-10-CM

## 2019-05-26 DIAGNOSIS — Z3A30 30 weeks gestation of pregnancy: Secondary | ICD-10-CM | POA: Diagnosis not present

## 2019-05-26 DIAGNOSIS — O10913 Unspecified pre-existing hypertension complicating pregnancy, third trimester: Secondary | ICD-10-CM | POA: Diagnosis not present

## 2019-05-26 DIAGNOSIS — Z1389 Encounter for screening for other disorder: Secondary | ICD-10-CM

## 2019-05-26 DIAGNOSIS — Z331 Pregnant state, incidental: Secondary | ICD-10-CM

## 2019-05-26 DIAGNOSIS — O10919 Unspecified pre-existing hypertension complicating pregnancy, unspecified trimester: Secondary | ICD-10-CM

## 2019-05-26 DIAGNOSIS — O0993 Supervision of high risk pregnancy, unspecified, third trimester: Secondary | ICD-10-CM

## 2019-05-26 DIAGNOSIS — R3121 Asymptomatic microscopic hematuria: Secondary | ICD-10-CM

## 2019-05-26 LAB — POCT URINALYSIS DIPSTICK OB
Glucose, UA: NEGATIVE
Nitrite, UA: NEGATIVE
POC,PROTEIN,UA: NEGATIVE

## 2019-05-26 MED ORDER — BLOOD GLUCOSE METER KIT: PACK | 0 refills | Status: DC

## 2019-05-26 NOTE — Patient Instructions (Signed)
Check Blood sugar first thing in the morning and 2 hours after each meal.   Why am I having leg cramps during pregnancy?  No one really knows why pregnant women get more leg cramps. It's possible that your leg muscles are tired from carrying around all of your extra weight. Or they may be aggravated by the pressure your expanding uterus puts on the blood vessels that return blood from your legs to your heart and the nerves that lead from your trunk to your legs.  Leg cramps may start to plague you during your second trimester and may get worse as your pregnancy progresses and your belly gets bigger. While these cramps can occur during the day, you'll probably notice them most at night, when they can interfere with your ability to get a good night's sleep.  How can I prevent leg cramps?  Try these tips for keeping leg cramps at bay:  Avoid standing or sitting with your legs crossed for long periods of time. Stretch your calf muscles regularly during the day and several times before you go to bed. Rotate your ankles and wiggle your toes when you sit, eat dinner, or watch TV. Take a walk every day, unless your midwife or doctor has advised you not to exercise. Avoid getting too tired. Lie down on your left side to improve circulation to and from your legs. Stay hydrated during the day by drinking water regularly. Try a warm bath before bed to relax your muscles. Some research suggests that taking a magnesium supplement in addition to a prenatal vitamin may help some women avoid leg cramps. However, other research showed that magnesium supplements had no significant effect on the frequency or intensity of leg cramps during pregnancy.You may have heard that having leg cramps is a sign that you need more calcium, and that calcium supplements will relieve the problem. Though it's certainly important to get enough calcium, there's no good evidence that taking extra calcium will help prevent leg cramps  during pregnancy. In fact, in one well-designed study, pregnant women taking calcium got no more relief from leg cramps than those taking a placebo.  You may try Chelated Magnesium at a dosage of 240-300mg /day.  What's the best way to relieve a cramp when I get one?  If you do get a cramp, immediately stretch your calf muscles: Straighten your leg, heel first, and gently flex your toes back toward your shins. It might hurt at first, but it will ease the spasm and the pain will gradually go away.  You can try to relax the cramp by massaging the muscle or warming it with a hot water bottle. Walking around for a few minutes may help too.  What if the pain persists?  Call your practitioner if your muscle pain is constant and not just an occasional cramp or if you notice swelling, redness, or tenderness in your leg, or the area feels warm to your touch. These may be signs of a blood clot, which requires immediate medical attention. Blood clots are relatively rare, but they're more common during pregnancy.      Tips to Help Leg Cramps  Increase dietary sources of calcium (milk, yogurt, cheese, leafy greens, seafood, legumes, and fruit) and magnesium (dark leafy greens, nuts, seeds, fish, beans, whole grains, avocados, yogurt, bananas, dried fruit, dark chocolate)  Spoonful of regular yellow mustard every night  Pickle juice  Magnesium supplement: in the morning, at night (can find in the vitamin aisle)  Dorsiflexion of foot: pointing your toes back towards your knee during the cramp

## 2019-05-26 NOTE — Progress Notes (Signed)
HIGH-RISK PREGNANCY VISIT Patient name: Mary Hardy MRN [id number]  Date of birth: Jul 29, 1981 Chief Complaint:   High Risk Gestation  History of Present Illness:   Mary Hardy is a 38 y.o. [phone] female at 75w2dwith an Estimated Date of Delivery: 08/02/19 being seen today for ongoing management of a high-risk pregnancy complicated by chronic HTN, gestational DM.  Today she reports didn't get meter. Refaxed order. . Contractions: Irregular. Vag. Bleeding: None.  Movement: Present. denies leaking of fluid.  Review of Systems:   Pertinent items are noted in HPI Denies abnormal vaginal discharge w/ itching/odor/irritation, headaches, visual changes, shortness of breath, chest pain, abdominal pain, severe nausea/vomiting, or problems with urination or bowel movements unless otherwise stated above.    Pertinent History Reviewed:  Medical & Surgical Hx:   Past Medical History:  Diagnosis Date  . Anemia   . Anxiety   . Chronic hypertension   . Depression   . Eclampsia (toxemia of pregnancy)   . History of eating disorder   . Incontinence of urine    due to nerve and muscle damage during child birth   Past Surgical History:  Procedure Laterality Date  . GALLBLADDER SURGERY    . MOUTH SURGERY     dental  . WISDOM TOOTH EXTRACTION     Family History  Problem Relation Age of Onset  . Glaucoma Paternal Grandfather   . Heart attack Paternal Grandfather   . Glaucoma Paternal Grandmother   . Heart attack Maternal Grandmother   . Heart disease Maternal Grandmother   . Diabetes Maternal Grandmother   . Heart attack Maternal Grandfather   . Heart disease Maternal Grandfather   . Carpal tunnel syndrome Mother   . Fibromyalgia Mother   . Heart disease Mother   . ADD / ADHD Daughter   . Cancer Maternal Aunt   . Heart attack Maternal Uncle     Current Outpatient Medications:  .  aspirin EC 81 MG tablet, Take 162 mg by mouth daily., Disp: , Rfl:  .  Calcium Carbonate Antacid (TUMS  PO), Take by mouth as needed., Disp: , Rfl:  .  Famotidine (PEPCID AC PO), Take by mouth 2 (two) times a day., Disp: , Rfl:  .  nebivolol (BYSTOLIC) 5 MG tablet, Take 1 tablet (5 mg total) by mouth daily., Disp: 30 tablet, Rfl: 0 .  prenatal vitamin w/FE, FA (PRENATAL 1 + 1) 27-1 MG TABS tablet, Take 1 tablet by mouth daily at 12 noon., Disp: 30 each, Rfl: 11 .  blood glucose meter kit and supplies, Dispense based on patient and insurance preference. Check blood sugar four times daily as directed. (FOR ICD-10 E10.9, E11.9)., Disp: 1 each, Rfl: 0 Social History: Reviewed -  reports that she quit smoking about 3 years ago. Her smoking use included e-cigarettes. She has never used smokeless tobacco.   Physical Assessment:   Vitals:   05/26/19 1149  BP: 126/84  Pulse: (!) 108  Weight: 212 lb (96.2 kg)  Body mass index is 38.78 kg/m.           Physical Examination:   General appearance: alert, well appearing, and in no distress  Mental status: alert, oriented to person, place, and time  Skin: warm & dry   Extremities: Edema: Trace    Cardiovascular: normal heart rate noted  Respiratory: normal respiratory effort, no distress  Abdomen: gravid, soft, non-tender  Pelvic: Cervical exam deferred         Fetal Status:  Movement: Present    Fetal Surveillance Testing today: [place] 55+3 wks,cephalic,cx 3.6 cm,anterior placenta gr 3,afi 12 cm,normal ovaries bilat,fhr 144 bpm,efw 1815 g 83%,AC 89%  Results for orders placed or performed in visit on 05/26/19 (from the past 24 hour(s))  POC Urinalysis Dipstick OB   Collection Time: 05/26/19 11:53 AM  Result Value Ref Range   Color, UA     Clarity, UA     Glucose, UA Negative Negative   Bilirubin, UA     Ketones, UA trace    Spec Grav, UA     Blood, UA 3+    pH, UA     POC,PROTEIN,UA Negative Negative, Trace, Small (1+), Moderate (2+), Large (3+), 4+   Urobilinogen, UA     Nitrite, UA neg    Leukocytes, UA Moderate (2+) (A) Negative    Appearance     Odor      Assessment & Plan:  1) High-risk pregnancy [id number] at 24w2dwith an Estimated Date of Delivery: 08/02/19   2) CHTN, stable.  Treatment Plan:   Baby ASA     2x/wk testing nst/sono @ 32wks    Deliver @  39wks (meds):   3) GDM, hasnt gotten meter, rx resent.  Treatment Plan:  Per CHTN guidelines  Labs/procedures today:    Follow-up: Return in about 2 weeks (around 06/09/2019) for BPP w/NST every Tuesday. (husband can't get off work to watch 549yo more than once a week and no family here).   Future Appointments  Date Time Provider DClayton 06/07/2019 11:45 AM [facility] [place] None  06/07/2019  1:30 PM FJonnie Kind MD [facility]  06/08/2019  4:00 PM [place] and [place] 06/14/2019  3:00 PM BRoma Schanz CNM [facility]TO[facility]  06/21/2019  8:30 AM [place] [place] None  06/21/2019  9:15 AM EFlorian Buff MD [facility]TO[facility]  06/28/2019  3:00 PM [facility] [place] None  06/28/2019  3:45 PM EFlorian Buff MD [facility]  07/05/2019  3:00 PM [facility] [place] None  07/05/2019  3:45 PM EFlorian Buff MD [facility]  07/12/2019  3:00 PM [facility] [place] None  07/12/2019  3:45 PM EFlorian Buff MD [facility]  07/19/2019  3:00 PM [facility] [place] None  07/19/2019  3:45 PM BRoma Schanz CNM [facility]  07/26/2019  3:00 PM [place]- [facility]TO[facility] [place] None  07/26/2019  3:45 PM Cresenzo, LPatrecia Pour RN [facility]TO[facility]YN    Orders Placed This Encounter  Procedures  . Urine Culture  . POC Urinalysis Dipstick OB   FChristin FudgeCNM 05/26/2019 2:35 PM

## 2019-05-26 NOTE — Progress Notes (Signed)
[place] 50+0 wks,cephalic,cx 3.6 cm,anterior placenta gr 3,afi 12 cm,normal ovaries bilat,fhr 144 bpm,efw 1815 g 83%,AC 89%

## 2019-05-28 LAB — URINE CULTURE

## 2019-05-30 ENCOUNTER — Encounter: Payer: No Typology Code available for payment source | Admitting: Women's Health

## 2019-05-30 ENCOUNTER — Other Ambulatory Visit: Payer: No Typology Code available for payment source

## 2019-06-01 ENCOUNTER — Other Ambulatory Visit: Payer: Self-pay | Admitting: Student

## 2019-06-01 ENCOUNTER — Other Ambulatory Visit: Payer: Self-pay | Admitting: Advanced Practice Midwife

## 2019-06-01 ENCOUNTER — Other Ambulatory Visit: Payer: Self-pay | Admitting: Obstetrics and Gynecology

## 2019-06-01 ENCOUNTER — Other Ambulatory Visit: Payer: Self-pay

## 2019-06-01 DIAGNOSIS — R8271 Bacteriuria: Secondary | ICD-10-CM

## 2019-06-01 MED ORDER — AMPICILLIN 500 MG PO CAPS: 500.0000 mg | ORAL_CAPSULE | Freq: Three times a day (TID) | ORAL | 0 refills | Status: DC

## 2019-06-01 MED ORDER — NEBIVOLOL HCL 5 MG PO TABS: 5.0000 mg | ORAL_TABLET | Freq: Every day | ORAL | 0 refills | Status: DC

## 2019-06-01 NOTE — Progress Notes (Signed)
bystolic renewed at 5 mg /d dosing x 30 d

## 2019-06-01 NOTE — Telephone Encounter (Signed)
Will renew bystolic at 5mg /d

## 2019-06-06 ENCOUNTER — Other Ambulatory Visit: Payer: Self-pay | Admitting: Advanced Practice Midwife

## 2019-06-06 DIAGNOSIS — O24419 Gestational diabetes mellitus in pregnancy, unspecified control: Secondary | ICD-10-CM

## 2019-06-06 DIAGNOSIS — N83202 Unspecified ovarian cyst, left side: Secondary | ICD-10-CM

## 2019-06-07 ENCOUNTER — Ambulatory Visit (INDEPENDENT_AMBULATORY_CARE_PROVIDER_SITE_OTHER): Payer: No Typology Code available for payment source

## 2019-06-07 ENCOUNTER — Other Ambulatory Visit: Payer: Self-pay

## 2019-06-07 ENCOUNTER — Encounter: Payer: Self-pay | Admitting: Obstetrics and Gynecology

## 2019-06-07 ENCOUNTER — Ambulatory Visit (INDEPENDENT_AMBULATORY_CARE_PROVIDER_SITE_OTHER): Payer: No Typology Code available for payment source | Admitting: Obstetrics and Gynecology

## 2019-06-07 VITALS — BP 126/86 | HR 96 | Wt 209.8 lb

## 2019-06-07 DIAGNOSIS — N83202 Unspecified ovarian cyst, left side: Secondary | ICD-10-CM

## 2019-06-07 DIAGNOSIS — O24419 Gestational diabetes mellitus in pregnancy, unspecified control: Secondary | ICD-10-CM

## 2019-06-07 DIAGNOSIS — Z3A32 32 weeks gestation of pregnancy: Secondary | ICD-10-CM

## 2019-06-07 DIAGNOSIS — Z1389 Encounter for screening for other disorder: Secondary | ICD-10-CM

## 2019-06-07 DIAGNOSIS — O3483 Maternal care for other abnormalities of pelvic organs, third trimester: Secondary | ICD-10-CM | POA: Diagnosis not present

## 2019-06-07 DIAGNOSIS — Z331 Pregnant state, incidental: Secondary | ICD-10-CM

## 2019-06-07 DIAGNOSIS — O0992 Supervision of high risk pregnancy, unspecified, second trimester: Secondary | ICD-10-CM

## 2019-06-07 DIAGNOSIS — O0993 Supervision of high risk pregnancy, unspecified, third trimester: Secondary | ICD-10-CM

## 2019-06-07 LAB — POCT URINALYSIS DIPSTICK OB
Blood, UA: NEGATIVE
Glucose, UA: NEGATIVE
Leukocytes, UA: NEGATIVE
Nitrite, UA: NEGATIVE

## 2019-06-07 MED ORDER — GLYBURIDE 2.5 MG PO TABS: 2.5000 mg | ORAL_TABLET | Freq: Two times a day (BID) | ORAL | 3 refills | Status: DC

## 2019-06-07 NOTE — Progress Notes (Signed)
HIGH-RISK PREGNANCY VISIT Patient name: Mary Hardy MRN [id number]  Date of birth: February 13, 1981 Chief Complaint:   Routine Prenatal Visit (BPP)  History of Present Illness:   Mary Hardy is a 38 y.o. [phone] [id number] female at 1830w0d with an Estimated Date of Delivery: 08/02/19 being seen today for ongoing management of a high-risk pregnancy complicated by chronic HTN, gestational DM. CBG's fasting are 144-165, PC's are 132-211 over last 3 d. Only one AC is Normal in 3 d. Today she reports no unusual c/o. Marland Kitchen. Contractions: Irregular. Vag. Bleeding: None.  Movement: Present. denies leaking of fluid.  Review of Systems:   Pertinent items are noted in HPI Denies abnormal vaginal discharge w/ itching/odor/irritation, headaches, visual changes, shortness of breath, chest pain, abdominal pain, severe nausea/vomiting, or problems with urination or bowel movements unless otherwise stated above. Pertinent History Reviewed:  Reviewed past medical,surgical, social, obstetrical and family history.  Reviewed problem list, medications and allergies. Physical Assessment:   Vitals:   06/07/19 1341  BP: 126/86  Pulse: 96  Weight: 209 lb 12.8 oz (95.2 kg)  Body mass index is 38.37 kg/m.           Physical Examination:   General appearance: alert, well appearing, and in no distress, oriented to person, place, and time and overweight  Mental status: alert, oriented to person, place, and time, normal mood, behavior, speech, dress, motor activity, and thought processes  Skin: warm & dry   Extremities: Edema: Trace    Cardiovascular: normal heart rate noted  Respiratory: normal respiratory effort, no distress  Abdomen: gravid, soft, non-tender  Pelvic: Cervical exam deferred         Fetal Status:     Movement: Present    Fetal Surveillance Testing today: BPP 8/8.          Dopplers 80%ile.   Results for orders placed or performed in visit on 06/07/19 (from the past 24 hour(s))  POC Urinalysis Dipstick OB   Collection Time: 06/07/19  1:46 PM  Result Value Ref Range   Color, UA     Clarity, UA     Glucose, UA Negative Negative   Bilirubin, UA     Ketones, UA large    Spec Grav, UA     Blood, UA neg    pH, UA     POC,PROTEIN,UA Small (1+) Negative, Trace, Small (1+), Moderate (2+), Large (3+), 4+   Urobilinogen, UA     Nitrite, UA neg    Leukocytes, UA Negative Negative   Appearance     Odor      Assessment & Plan:  1) High-risk pregnancy [id number] at 6530w0d with an Estimated Date of Delivery: 08/02/19   2) CHTN on bystolic 5, stable  3) Gest DM, now A2 started on Glyburide2.5 bid consider increase in 2-3 d, unstable  Meds:  Meds ordered this encounter  Medications  . glyBURIDE (DIABETA) 2.5 MG tablet    Sig: Take 1 tablet (2.5 mg total) by mouth 2 (two) times daily with a meal.    Dispense:  60 tablet    Refill:  3    Labs/procedures today: u/s  Treatment Plan:  Add Gyburide. At 2/5 bid.  Reviewed:DM, [facility] All questions were answered. has home bp cuff. . Check bp daily consistently 110-120/80's, let us know if >140/90.   Follow-up: 1 wk BPP.  Orders Placed This Encounter  Procedures  . POC Urinalysis Dipstick OB   Tilda BurrowJohn V Matti Minney MD.  06/07/2019 2:02 PM

## 2019-06-07 NOTE — Addendum Note (Signed)
Addended by: Jonnie Kind on: 06/07/2019 02:11 PM   Modules accepted: Orders

## 2019-06-07 NOTE — Progress Notes (Signed)
[place] 32 wks,cephalic,BPP 5/4,SFKCLEXN placenta gr 3,fhr 142 bpm,afi 13 cm,RI .70,.67,.71=84%

## 2019-06-08 ENCOUNTER — Encounter: Payer: No Typology Code available for payment source | Attending: Obstetrics & Gynecology | Admitting: *Deleted

## 2019-06-08 ENCOUNTER — Ambulatory Visit: Payer: No Typology Code available for payment source | Admitting: *Deleted

## 2019-06-08 LAB — HEMOGLOBIN A1C
Est. average glucose Bld gHb Est-mCnc: 114 mg/dL
Hgb A1c MFr Bld: 5.6 % (ref 4.8–5.6)

## 2019-06-08 NOTE — Progress Notes (Signed)
  Patient was seen on 06/08/2019 for Gestational Diabetes self-management. EDD 08/02/2019. Patient states no  history of GDM. Diet history obtained. Patient eats a good variety of all food groups. Beverages include Milk, unsweetened tea, water, Crystal Light.  The following learning objectives were met by the patient :   States the definition of Gestational Diabetes  States why dietary management is important in controlling blood glucose  Describes the effects of carbohydrates on blood glucose levels  Demonstrates ability to create a balanced meal plan  Demonstrates carbohydrate counting   States when to check blood glucose levels  Demonstrates proper blood glucose monitoring techniques  States the effect of stress and exercise on blood glucose levels  States the importance of limiting caffeine and abstaining from alcohol and smoking  Plan:  Aim for 3 Carb Choices per meal (45 grams) +/- 1 either way  Aim for 1-2 Carbs per snack Begin reading food labels for Total Carbohydrate of foods If OK with your MD, consider  increasing your activity level by walking, Arm Chair Exercises or other activity daily as tolerated Begin checking BG before breakfast and 2 hours after first bite of breakfast, lunch and dinner as directed by MD  Bring Log Book/Sheet and meter to every medical appointment OR use Baby Scripts (see below) Baby Scripts:  Patient was introduced to [employer] and she plans to use as record of BG electronically, she was signed up for [employer]. She has been recording  BG on Log Sheet  Take medication if directed by MD. Pt is taking Glyburide BID 2.5 mg.  Nonah Mattes, RN, IBCLC under direct supervision of Jeanie Sewer, Diabetes Educator  Pt is using a [employer] that she ordered off [employer]. She reports she has supplies to check her blood blood sugars.  Patient is testing pre breakfast and 2 hours each meal as directed by MD Pt did not bring her Blood Glucose  Log sheet, she reports Fasting Blood Glucoses of 130-160 and post meals 121-150  Patient instructed to monitor glucose levels: FBS: 60 - 95 mg/dl 2 hour: <120 mg/dl  Patient received the following handouts:  Nutrition Diabetes and Pregnancy  Carbohydrate Counting List  Patient will be seen for follow-up as needed

## 2019-06-14 ENCOUNTER — Encounter: Payer: Self-pay | Admitting: Obstetrics and Gynecology

## 2019-06-14 ENCOUNTER — Ambulatory Visit (INDEPENDENT_AMBULATORY_CARE_PROVIDER_SITE_OTHER): Payer: No Typology Code available for payment source | Admitting: Obstetrics and Gynecology

## 2019-06-14 ENCOUNTER — Other Ambulatory Visit: Payer: Self-pay

## 2019-06-14 VITALS — BP 126/88 | HR 91 | Wt 210.0 lb

## 2019-06-14 DIAGNOSIS — O0992 Supervision of high risk pregnancy, unspecified, second trimester: Secondary | ICD-10-CM

## 2019-06-14 DIAGNOSIS — O1002 Pre-existing essential hypertension complicating childbirth: Secondary | ICD-10-CM | POA: Diagnosis not present

## 2019-06-14 DIAGNOSIS — Z3A33 33 weeks gestation of pregnancy: Secondary | ICD-10-CM

## 2019-06-14 DIAGNOSIS — Z1389 Encounter for screening for other disorder: Secondary | ICD-10-CM

## 2019-06-14 DIAGNOSIS — Z331 Pregnant state, incidental: Secondary | ICD-10-CM

## 2019-06-14 DIAGNOSIS — O99820 Streptococcus B carrier state complicating pregnancy: Secondary | ICD-10-CM | POA: Diagnosis not present

## 2019-06-14 DIAGNOSIS — O24429 Gestational diabetes mellitus in childbirth, unspecified control: Secondary | ICD-10-CM | POA: Diagnosis not present

## 2019-06-14 DIAGNOSIS — R8271 Bacteriuria: Secondary | ICD-10-CM

## 2019-06-14 LAB — POCT URINALYSIS DIPSTICK OB
Ketones, UA: NEGATIVE
Leukocytes, UA: NEGATIVE
Nitrite, UA: NEGATIVE

## 2019-06-14 NOTE — Progress Notes (Signed)
Patient ID: Mary Hardy, female   DOB: [birth year], 38 y.o.   MRN: [id number]    [facility] PREGNANCY [facility]IS[facility] Patient name: Mary Hardy MRN [id number]  Date of birth: 09/19/1981 Chief Complaint:   Routine Prenatal Visit (NST)  History of Present Illness:   Tishia Maestre is a 38 y.o. [phone] female at [redacted]w[redacted]d with an Estimated Date of Delivery: 08/02/19 being seen today for ongoing management of a high-risk pregnancy complicated by chronic HTN, gestational DM. Does well with eating at home, numbers stay in the 90s but having to go out and run errands and has to eat out the number go up into the 130. Notes that she feels much better when numbers are in the 90s Today she reports no complaints. Contractions: Irregular. Vag. Bleeding: None.  Movement: Present. denies leaking of fluid.  Review of Systems:   Pertinent items are noted in HPI Denies abnormal vaginal discharge w/ itching/odor/irritation, headaches, visual changes, shortness of breath, chest pain, abdominal pain, severe nausea/vomiting, or problems with urination or bowel movements unless otherwise stated above. Pertinent History Reviewed:  Reviewed past medical,surgical, social, obstetrical and family history.  Reviewed problem list, medications and allergies. Physical Assessment:   Vitals:   06/14/19 1501 06/14/19 1503  BP: 135/85 126/88  Pulse: 91 91  Weight: 210 lb (95.3 kg)   Body mass index is 38.41 kg/m.           Physical Examination:   General appearance: alert, well appearing, and in no distress  Mental status: alert, oriented to person, place, and time, normal mood, behavior, speech, dress, motor activity, and thought processes, affect appropriate to mood  Skin: warm & dry   Extremities: Edema: None    Cardiovascular: normal heart rate noted  Respiratory: normal respiratory effort, no distress  Abdomen: gravid, soft, non-tender  Pelvic: Cervical exam deferred         Fetal Status:     Movement: Present    Fetal  Surveillance Testing today: NST reactive  Results for orders placed or performed in visit on 06/14/19 (from the past 24 hour(s))  POC Urinalysis Dipstick OB   Collection Time: 06/14/19  3:03 PM  Result Value Ref Range   Color, UA     Clarity, UA     Glucose, UA Moderate (2+) (A) Negative   Bilirubin, UA     Ketones, UA neg    Spec Grav, UA     Blood, UA moderate    pH, UA     POC,PROTEIN,UA Small (1+) Negative, Trace, Small (1+), Moderate (2+), Large (3+), 4+   Urobilinogen, UA     Nitrite, UA neg    Leukocytes, UA Negative Negative   Appearance     Odor      Assessment & Plan:  1) High-risk pregnancy [id number] at [redacted]w[redacted]d with an Estimated Date of Delivery: 08/02/19   2) CHTN, stable  3) GDM, stable  Meds: No orders of the defined types were placed in this encounter.   Labs/procedures today: NST  Treatment Plan:  F/u 1 week NST  Reviewed: Preterm labor symptoms and general obstetric precautions including but not limited to vaginal bleeding, contractions, leaking of fluid and fetal movement were reviewed in detail with the patient.    Follow-up: No follow-ups on file.  Orders Placed This Encounter  Procedures  . POC Urinalysis Dipstick OB   By signing my name below, I, Samul Dada, attest that this documentation has been prepared under the direction and in the presence of [place],  Cyndi LennertJohn V, MD. Electronically Signed: Arnette NorrisMari Johnson Medical Scribe. 06/14/19. 3:24 PM.  I personally performed the services described in this documentation, which was SCRIBED in my presence. The recorded information has been reviewed and considered accurate. It has been edited as necessary during review. Tilda BurrowJohn V Cong Hightower, MD

## 2019-06-20 ENCOUNTER — Other Ambulatory Visit: Payer: Self-pay | Admitting: Obstetrics and Gynecology

## 2019-06-20 DIAGNOSIS — O10919 Unspecified pre-existing hypertension complicating pregnancy, unspecified trimester: Secondary | ICD-10-CM

## 2019-06-21 ENCOUNTER — Ambulatory Visit (INDEPENDENT_AMBULATORY_CARE_PROVIDER_SITE_OTHER): Payer: No Typology Code available for payment source | Admitting: Obstetrics & Gynecology

## 2019-06-21 ENCOUNTER — Ambulatory Visit (INDEPENDENT_AMBULATORY_CARE_PROVIDER_SITE_OTHER): Payer: No Typology Code available for payment source

## 2019-06-21 ENCOUNTER — Other Ambulatory Visit: Payer: Self-pay

## 2019-06-21 ENCOUNTER — Encounter: Payer: Self-pay | Admitting: Obstetrics & Gynecology

## 2019-06-21 VITALS — BP 132/94 | HR 89 | Wt 206.0 lb

## 2019-06-21 DIAGNOSIS — Z3A34 34 weeks gestation of pregnancy: Secondary | ICD-10-CM | POA: Diagnosis not present

## 2019-06-21 DIAGNOSIS — Z331 Pregnant state, incidental: Secondary | ICD-10-CM

## 2019-06-21 DIAGNOSIS — O10919 Unspecified pre-existing hypertension complicating pregnancy, unspecified trimester: Secondary | ICD-10-CM

## 2019-06-21 DIAGNOSIS — O10913 Unspecified pre-existing hypertension complicating pregnancy, third trimester: Secondary | ICD-10-CM | POA: Diagnosis not present

## 2019-06-21 DIAGNOSIS — O24419 Gestational diabetes mellitus in pregnancy, unspecified control: Secondary | ICD-10-CM

## 2019-06-21 DIAGNOSIS — Z1389 Encounter for screening for other disorder: Secondary | ICD-10-CM

## 2019-06-21 DIAGNOSIS — O0993 Supervision of high risk pregnancy, unspecified, third trimester: Secondary | ICD-10-CM

## 2019-06-21 LAB — POCT URINALYSIS DIPSTICK OB
Blood, UA: NEGATIVE
Glucose, UA: NEGATIVE
Leukocytes, UA: NEGATIVE
Nitrite, UA: NEGATIVE

## 2019-06-21 NOTE — Progress Notes (Signed)
[place] 34 wks,cephalic,BPP 5/9,XHFSFSEL placenta gr 3,normal ovaries bilat,afi 12 cm,fhr 127 bpm,RI .70,.64,.72,.65=83%

## 2019-06-21 NOTE — Progress Notes (Signed)
   HIGH-RISK PREGNANCY VISIT Patient name: Mary Hardy MRN [id number]  Date of birth: 12-Sep-1981 Chief Complaint:   High Risk Gestation (BPP)  History of Present Illness:   Mary Hardy is a 38 y.o. [phone] female at [redacted]w[redacted]d with an Estimated Date of Delivery: 08/02/19 being seen today for ongoing management of a high-risk pregnancy complicated by [facility] currently on bystolic 5 mg daily and Class A2/B DM on glyburide 2.5 mg BID Today she reports no complaints. Contractions: Irregular.  .  Movement: Present. denies leaking of fluid.  Review of Systems:   Pertinent items are noted in HPI Denies abnormal vaginal discharge w/ itching/odor/irritation, headaches, visual changes, shortness of breath, chest pain, abdominal pain, severe nausea/vomiting, or problems with urination or bowel movements unless otherwise stated above. Pertinent History Reviewed:  Reviewed past medical,surgical, social, obstetrical and family history.  Reviewed problem list, medications and allergies. Physical Assessment:   Vitals:   06/21/19 0857  BP: (!) 132/94  Pulse: 89  Weight: 206 lb (93.4 kg)  Body mass index is 37.68 kg/m.           Physical Examination:   General appearance: alert, well appearing, and in no distress  Mental status: alert, oriented to person, place, and time  Skin: warm & dry   Extremities: Edema: None    Cardiovascular: normal heart rate noted  Respiratory: normal respiratory effort, no distress  Abdomen: gravid, soft, non-tender  Pelvic: Cervical exam deferred         Fetal Status: Fetal Heart Rate (bpm): 144 Fundal Height: 36 cm Movement: Present    Fetal Surveillance Testing today: BPP 8/8 with normal Dopplers   Results for orders placed or performed in visit on 06/21/19 (from the past 24 hour(s))  POC Urinalysis Dipstick OB   Collection Time: 06/21/19  9:03 AM  Result Value Ref Range   Color, UA     Clarity, UA     Glucose, UA Negative Negative   Bilirubin, UA     Ketones, UA  moderate    Spec Grav, UA     Blood, UA neg    pH, UA     POC,PROTEIN,UA Small (1+) Negative, Trace, Small (1+), Moderate (2+), Large (3+), 4+   Urobilinogen, UA     Nitrite, UA neg    Leukocytes, UA Negative Negative   Appearance     Odor      Assessment & Plan:  1) High-risk pregnancy [id number] at [redacted]w[redacted]d with an Estimated Date of Delivery: 08/02/19   2) CHTN, stable, bystolic 5 mg daily, aspirin 162 mg daily  3) Class A/B DM,, stable, overall OK not perfect, continue glyburide at 2.5 mg BID  Meds: No orders of the defined types were placed in this encounter.   Labs/procedures today: BPP 8/8, normal Dopplers  Treatment Plan:  Continue twice weekly surveillance,   Reviewed: Preterm labor symptoms and general obstetric precautions including but not limited to vaginal bleeding, contractions, leaking of fluid and fetal movement were reviewed in detail with the patient.  All questions were answered.  home bp cuff. Rx faxed to . Check bp weekly, let us know if >140/90.   Follow-up: Return in about 3 days (around 06/24/2019) for NST, HROB.  Orders Placed This Encounter  Procedures  . POC Urinalysis Dipstick OB   Florian Buff  06/21/2019 9:32 AM

## 2019-06-24 ENCOUNTER — Encounter: Payer: Self-pay | Admitting: Obstetrics and Gynecology

## 2019-06-24 ENCOUNTER — Ambulatory Visit (INDEPENDENT_AMBULATORY_CARE_PROVIDER_SITE_OTHER): Payer: No Typology Code available for payment source | Admitting: Obstetrics and Gynecology

## 2019-06-24 ENCOUNTER — Other Ambulatory Visit: Payer: Self-pay

## 2019-06-24 VITALS — BP 131/97 | HR 100 | Wt 207.0 lb

## 2019-06-24 DIAGNOSIS — O10913 Unspecified pre-existing hypertension complicating pregnancy, third trimester: Secondary | ICD-10-CM

## 2019-06-24 DIAGNOSIS — O0992 Supervision of high risk pregnancy, unspecified, second trimester: Secondary | ICD-10-CM

## 2019-06-24 DIAGNOSIS — Z331 Pregnant state, incidental: Secondary | ICD-10-CM

## 2019-06-24 DIAGNOSIS — R8271 Bacteriuria: Secondary | ICD-10-CM

## 2019-06-24 DIAGNOSIS — O0993 Supervision of high risk pregnancy, unspecified, third trimester: Secondary | ICD-10-CM

## 2019-06-24 DIAGNOSIS — Z1389 Encounter for screening for other disorder: Secondary | ICD-10-CM

## 2019-06-24 DIAGNOSIS — O10919 Unspecified pre-existing hypertension complicating pregnancy, unspecified trimester: Secondary | ICD-10-CM

## 2019-06-24 DIAGNOSIS — Z3A34 34 weeks gestation of pregnancy: Secondary | ICD-10-CM

## 2019-06-24 LAB — POCT URINALYSIS DIPSTICK OB
Glucose, UA: NEGATIVE
Leukocytes, UA: NEGATIVE
Nitrite, UA: NEGATIVE

## 2019-06-24 NOTE — Progress Notes (Addendum)
Patient ID: Murry Khiev, female   DOB: Oct 16, 1981, 38 y.o.   MRN: [id number]    [facility] PREGNANCY VISIT Patient name: Mary Hardy MRN [id number]  Date of birth: 24-Sep-1981 Chief Complaint:   High Risk Gestation (NST/ room # 8)  History of Present Illness:   Mary Hardy is a 38 y.o. [phone] female at [redacted]w[redacted]d with an Estimated Date of Delivery: 08/02/19 being seen today for ongoing management of a high-risk pregnancy complicated by [facility] currently on bystolic 5 mg daily and GDM class A2 on glyburide 2.5 mg BID No fasting Today she reports no complaints. Contractions: Irregular.  .  Movement: Present. denies leaking of fluid.  Review of Systems:   Pertinent items are noted in HPI Denies abnormal vaginal discharge w/ itching/odor/irritation, headaches, visual changes, shortness of breath, chest pain, abdominal pain, severe nausea/vomiting, or problems with urination or bowel movements unless otherwise stated above. Pertinent History Reviewed:  Reviewed past medical,surgical, social, obstetrical and family history.  Reviewed problem list, medications and allergies. Physical Assessment:   Vitals:   06/24/19 1126  BP: (!) 131/97  Pulse: 100  Weight: 207 lb (93.9 kg)  Body mass index is 37.86 kg/m.           Physical Examination:   General appearance: alert, well appearing, and in no distress  Mental status: alert, oriented to person, place, and time, normal mood, behavior, speech, dress, motor activity, and thought processes, affect appropriate to mood  Skin: warm & dry   Extremities: Edema: Trace    Cardiovascular: normal heart rate noted  Respiratory: normal respiratory effort, no distress  Abdomen: gravid, soft, non-tender  Pelvic: Cervical exam deferred         Fetal Status:     Movement: Present    Fetal Surveillance Testing today: NST reactive   Results for orders placed or performed in visit on 06/24/19 (from the past 24 hour(s))  POC Urinalysis Dipstick OB   Collection  Time: 06/24/19 11:27 AM  Result Value Ref Range   Color, UA     Clarity, UA     Glucose, UA Negative Negative   Bilirubin, UA     Ketones, UA large    Spec Grav, UA     Blood, UA small    pH, UA     POC,PROTEIN,UA Small (1+) Negative, Trace, Small (1+), Moderate (2+), Large (3+), 4+   Urobilinogen, UA     Nitrite, UA neg    Leukocytes, UA Negative Negative   Appearance     Odor      Assessment & Plan:  1) High-risk pregnancy [id number] at [redacted]w[redacted]d with an Estimated Date of Delivery: 08/02/19   2) CHTN, stable consider increase to Bystolic 10 if BP rising.  3) GDM A2, stable  Meds: No orders of the defined types were placed in this encounter.   Labs/procedures today: NST  Treatment Plan:  Discontinue aspirin @ 36 weeks Growth anatomy @ 36 weeks  Reviewed: Preterm labor symptoms and general obstetric precautions including but not limited to vaginal bleeding, contractions, leaking of fluid and fetal movement were reviewed in detail with the patient.   Follow-up: phone call 8/2. Pt bp's at home are low systolic and borderline diastolic. Pt reports todays BP 118/92 Has appt 3 d.  Orders Placed This Encounter  Procedures  . POC Urinalysis Dipstick OB   By signing my name below, I, Samul Dada, attest that this documentation has been prepared under the direction and in the presence of Mary Hardy  V, MD. Electronically Signed: Arnette NorrisMari Johnson Medical Scribe. 06/24/19. 11:58 AM.  I personally performed the services described in this documentation, which was SCRIBED in my presence. The recorded information has been reviewed and considered accurate. It has been edited as necessary during review. Mary BurrowJohn Hardy [name], MD

## 2019-06-26 ENCOUNTER — Telehealth: Payer: Self-pay | Admitting: Obstetrics and Gynecology

## 2019-06-26 NOTE — Telephone Encounter (Signed)
See last office note 

## 2019-06-27 ENCOUNTER — Other Ambulatory Visit: Payer: Self-pay | Admitting: Obstetrics and Gynecology

## 2019-06-27 DIAGNOSIS — O24419 Gestational diabetes mellitus in pregnancy, unspecified control: Secondary | ICD-10-CM

## 2019-06-27 DIAGNOSIS — O10919 Unspecified pre-existing hypertension complicating pregnancy, unspecified trimester: Secondary | ICD-10-CM

## 2019-06-28 ENCOUNTER — Encounter: Payer: Self-pay | Admitting: Women's Health

## 2019-06-28 ENCOUNTER — Other Ambulatory Visit: Payer: Self-pay

## 2019-06-28 ENCOUNTER — Ambulatory Visit (INDEPENDENT_AMBULATORY_CARE_PROVIDER_SITE_OTHER): Payer: No Typology Code available for payment source | Admitting: Women's Health

## 2019-06-28 ENCOUNTER — Ambulatory Visit (INDEPENDENT_AMBULATORY_CARE_PROVIDER_SITE_OTHER): Payer: No Typology Code available for payment source

## 2019-06-28 VITALS — BP 126/87 | HR 89 | Wt 208.0 lb

## 2019-06-28 DIAGNOSIS — O09299 Supervision of pregnancy with other poor reproductive or obstetric history, unspecified trimester: Secondary | ICD-10-CM

## 2019-06-28 DIAGNOSIS — O10919 Unspecified pre-existing hypertension complicating pregnancy, unspecified trimester: Secondary | ICD-10-CM

## 2019-06-28 DIAGNOSIS — O10913 Unspecified pre-existing hypertension complicating pregnancy, third trimester: Secondary | ICD-10-CM

## 2019-06-28 DIAGNOSIS — Z331 Pregnant state, incidental: Secondary | ICD-10-CM

## 2019-06-28 DIAGNOSIS — Z3A35 35 weeks gestation of pregnancy: Secondary | ICD-10-CM

## 2019-06-28 DIAGNOSIS — O1213 Gestational proteinuria, third trimester: Secondary | ICD-10-CM

## 2019-06-28 DIAGNOSIS — O24419 Gestational diabetes mellitus in pregnancy, unspecified control: Secondary | ICD-10-CM

## 2019-06-28 DIAGNOSIS — O0993 Supervision of high risk pregnancy, unspecified, third trimester: Secondary | ICD-10-CM

## 2019-06-28 DIAGNOSIS — Z1389 Encounter for screening for other disorder: Secondary | ICD-10-CM

## 2019-06-28 DIAGNOSIS — O09293 Supervision of pregnancy with other poor reproductive or obstetric history, third trimester: Secondary | ICD-10-CM

## 2019-06-28 LAB — POCT URINALYSIS DIPSTICK OB
Glucose, UA: NEGATIVE
Nitrite, UA: NEGATIVE

## 2019-06-28 MED ORDER — METFORMIN HCL 500 MG PO TABS: 500.0000 mg | ORAL_TABLET | Freq: Every day | ORAL | 1 refills | Status: DC

## 2019-06-28 NOTE — Patient Instructions (Signed)
Mary Hardy, I greatly value your feedback.  If you receive a survey following your visit with [place] today, we appreciate you taking the time to fill it out.  Thanks, Knute Neu, CNM, [facility]  [place]!!! It is now [place] at [facility] ([place], [place] [address]) Entrance located off of [address] parking   Go to [email or web address] to register for FREE online childbirth classes   Check blood sugars 4 times a day: in the morning before eating/drinking anything (<95) and 2 hours after your first bite of breakfast, lunch, and supper (<120).     Call the office [phone]) or go to [facility] if:  You begin to have strong, frequent contractions  Your water breaks.  Sometimes it is a big gush of fluid, sometimes it is just a trickle that keeps getting your panties wet or running down your legs  You have vaginal bleeding.  It is normal to have a small amount of spotting if your cervix was checked.   You don't feel your baby moving like normal.  If you don't, get you something to eat and drink and lay down and focus on feeling your baby move.  You should feel at least 10 movements in 2 hours.  If you don't, you should call the office or go to [facility].   Call the office [phone]) or go to [facility] hospital for these signs of pre-eclampsia:  Severe headache that does not go away with Tylenol  Visual changes- seeing spots, double, blurred vision  Pain under your right breast or upper abdomen that does not go away with Tums or heartburn medicine  Nausea and/or vomiting  Severe swelling in your hands, feet, and face      Home Blood Pressure Monitoring for Patients   Check your blood pressure 4 times daily and keep a log, bring this with you to all appointments.  If blood pressure is >=160 on top or >=110 on bottom, check again in 5 minutes, if still this high, call us or go to [facility] to be  evaluated   Helpful Tips for Accurate Home Blood Pressure Checks  . Don't smoke, exercise, or drink caffeine 30 minutes before checking your BP . Use the restroom before checking your BP (a full bladder can raise your pressure) . Relax in a comfortable upright chair . Feet on the ground . Left arm resting comfortably on a flat surface at the level of your heart . Legs uncrossed . Back supported . Sit quietly and don't talk . Place the cuff on your bare arm . Adjust snuggly, so that only two fingertips can fit between your skin and the top of the cuff . Check 2 readings separated by at least one minute . Keep a log of your BP readings . For a visual, please reference this diagram: http://ccnc.care/bpdiagram  Provider Name: [facility]     Phone: [phone]  Zone 1: ALL CLEAR  Continue to monitor your symptoms:  . BP reading is less than 140 (top number) or less than 90 (bottom number)  . No right upper stomach pain . No headaches or seeing spots . No feeling nauseated or throwing up . No swelling in face and hands  Zone 2: CAUTION Call your doctor's office for any of the following:  . BP reading is greater than 140 (top number) or greater than 90 (bottom number)  . Stomach pain under your  ribs in the middle or right side . Headaches or seeing spots . Feeling nauseated or throwing up . Swelling in face and hands  Zone 3: EMERGENCY  Seek immediate medical care if you have any of the following:  . BP reading is greater than160 (top number) or greater than 110 (bottom number) . Severe headaches not improving with Tylenol . Serious difficulty catching your breath . Any worsening symptoms from Zone 2

## 2019-06-28 NOTE — Progress Notes (Signed)
HIGH-RISK PREGNANCY VISIT Patient name: Mary Hardy MRN [id number]  Date of birth: 05-30-1981 Chief Complaint:   Routine Prenatal Visit ([place])  History of Present Illness:   Mary Hardy is a 38 y.o. [phone] female at [redacted]w[redacted]d with an Estimated Date of Delivery: 08/02/19 being seen today for ongoing management of a high-risk pregnancy complicated by [facility] currently on bystolic 5mg  daily, Overt DM dx @ 29wks currently on glyburide 2.5mg  BID, h/o eclampsia and pp pre-e Today she reports not checking fastings, not eating breakfast d/t nausea, then checks before and 2hrs after lunch and supper- so only useful numbers we have today are 2 postprandials daily which have been running 98-251 (7 of 9 >120), states she must have misunderstood how to check sugars. BPs at home 107-132/79-95. Denies ha, visual changes, ruq/epigastric pain, n/v.   Contractions: Irregular. Vag. Bleeding: None.  Movement: Present. denies leaking of fluid.  Review of Systems:   Pertinent items are noted in HPI Denies abnormal vaginal discharge w/ itching/odor/irritation, headaches, visual changes, shortness of breath, chest pain, abdominal pain, severe nausea/vomiting, or problems with urination or bowel movements unless otherwise stated above. Pertinent History Reviewed:  Reviewed past medical,surgical, social, obstetrical and family history.  Reviewed problem list, medications and allergies. Physical Assessment:   Vitals:   06/28/19 1538  BP: 126/87  Pulse: 89  Weight: 208 lb (94.3 kg)  Body mass index is 38.04 kg/m.           Physical Examination:   General appearance: alert, well appearing, and in no distress  Mental status: alert, oriented to person, place, and time  Skin: warm & dry   Extremities: Edema: Trace    Cardiovascular: normal heart rate noted  Respiratory: normal respiratory effort, no distress  Abdomen: gravid, soft, non-tender  Pelvic: Cervical exam deferred         Fetal Status: Fetal Heart Rate  (bpm): 159 u/s   Movement: Present Presentation: Pilar Plate Breech  Fetal Surveillance Testing today: [place] 35 wks,frank breech,BPP 8/8,anterior placenta gr 3,afi 12.6 cm,fhr 159 bpm,RI .66,.61,.67=78%  Results for orders placed or performed in visit on 06/28/19 (from the past 24 hour(s))  POC Urinalysis Dipstick OB   Collection Time: 06/28/19  3:43 PM  Result Value Ref Range   Color, UA     Clarity, UA     Glucose, UA Negative Negative   Bilirubin, UA     Ketones, UA large    Spec Grav, UA     Blood, UA small    pH, UA     POC,PROTEIN,UA Moderate (2+) Negative, Trace, Small (1+), Moderate (2+), Large (3+), 4+   Urobilinogen, UA     Nitrite, UA neg    Leukocytes, UA Large (3+) (A) Negative   Appearance     Odor      Assessment & Plan:  1) High-risk pregnancy [id number] at [redacted]w[redacted]d with an Estimated Date of Delivery: 08/02/19   2) CHTN, stable on bystolic 5mg  daily, does have 2+proteinuria today, asymptomatic, will check pre-e labs and urine cx  3) Overt DM, unstable, dx @ 29wks, not checking sugars at correct times, reviewed correct timing/wrote down for her. Continue glyburide 2.5mg  BID, add metformin 500mg  AM. Discussed when brings fastings on Friday, we may need to add PM dose depending on numbers.   4) H/O eclampsia and pp pre-e> on ASA, 2+ proteinuria today, asymptomatic, check pre-e labs, reviewed pre-e s/s, reasons to seek care. Check bp's QID at home, reviewed severe-range  5) Breech presentation  Meds:  Meds ordered this encounter  Medications  . metFORMIN (GLUCOPHAGE) 500 MG tablet    Sig: Take 1 tablet (500 mg total) by mouth daily with breakfast.    Dispense:  30 tablet    Refill:  1    Order Specific Question:   Supervising Provider    Answer:   Lazaro ArmsEURE, LUTHER H [[id number]]    Labs/procedures today: u/s  Treatment Plan:  2x/wk testing nst alt w/ bpp/dopp, EFW @ 36wks, IOL @ 39wks  Reviewed: Preterm labor symptoms and general obstetric precautions including but not limited to  vaginal bleeding, contractions, leaking of fluid and fetal movement were reviewed in detail with the patient.  All questions were answered.   Follow-up: Return for As scheduled Friday for NST add provider visit please.  Orders Placed This Encounter  Procedures  . Urine Culture  . CBC  . Comprehensive metabolic panel  . Protein / creatinine ratio, urine  . POC Urinalysis Dipstick OB   Cheral MarkerKimberly R Zuly Belkin CNM, [facility]NP-BC 06/28/2019 4:52 PM

## 2019-06-28 NOTE — Progress Notes (Signed)
[place] 35 wks,frank breech,BPP 8/8,anterior placenta gr 3,afi 12.6 cm,fhr 159 bpm,RI .66,.61,.67=78%

## 2019-06-29 LAB — CBC
Hematocrit: 35 % (ref 34.0–46.6)
Hemoglobin: 12.1 g/dL (ref 11.1–15.9)
MCH: 29.4 pg (ref 26.6–33.0)
MCHC: 34.6 g/dL (ref 31.5–35.7)
MCV: 85 fL (ref 79–97)
Platelets: 223 10*3/uL (ref 150–450)
RBC: 4.12 x10E6/uL (ref 3.77–5.28)
RDW: 14.2 % (ref 11.7–15.4)
WBC: 9.7 10*3/uL (ref 3.4–10.8)

## 2019-06-29 LAB — COMPREHENSIVE METABOLIC PANEL
ALT: 27 IU/L (ref 0–32)
AST: 21 IU/L (ref 0–40)
Albumin/Globulin Ratio: 1.5 (ref 1.2–2.2)
Albumin: 3.7 g/dL — ABNORMAL LOW (ref 3.8–4.8)
Alkaline Phosphatase: 174 IU/L — ABNORMAL HIGH (ref 39–117)
BUN/Creatinine Ratio: 11 (ref 9–23)
BUN: 8 mg/dL (ref 6–20)
Bilirubin Total: 0.2 mg/dL (ref 0.0–1.2)
CO2: 18 mmol/L — ABNORMAL LOW (ref 20–29)
Calcium: 9.3 mg/dL (ref 8.7–10.2)
Chloride: 105 mmol/L (ref 96–106)
Creatinine, Ser: 0.71 mg/dL (ref 0.57–1.00)
GFR calc Af Amer: 128 mL/min/{1.73_m2} (ref >59)
GFR calc non Af Amer: 111 mL/min/{1.73_m2} (ref >59)
Globulin, Total: 2.5 g/dL (ref 1.5–4.5)
Glucose: 91 mg/dL (ref 65–99)
Potassium: 4.7 mmol/L (ref 3.5–5.2)
Sodium: 137 mmol/L (ref 134–144)
Total Protein: 6.2 g/dL (ref 6.0–8.5)

## 2019-06-29 LAB — PROTEIN / CREATININE RATIO, URINE
Creatinine, Urine: 183.7 mg/dL
Protein, Ur: 46.5 mg/dL
Protein/Creat Ratio: 253 mg/g creat — ABNORMAL HIGH (ref 0–200)

## 2019-07-01 ENCOUNTER — Encounter: Payer: Self-pay | Admitting: Women's Health

## 2019-07-01 ENCOUNTER — Ambulatory Visit (INDEPENDENT_AMBULATORY_CARE_PROVIDER_SITE_OTHER): Payer: No Typology Code available for payment source | Admitting: Women's Health

## 2019-07-01 ENCOUNTER — Other Ambulatory Visit: Payer: No Typology Code available for payment source

## 2019-07-01 ENCOUNTER — Other Ambulatory Visit: Payer: Self-pay

## 2019-07-01 VITALS — BP 129/92 | HR 91 | Wt 207.5 lb

## 2019-07-01 DIAGNOSIS — Z1389 Encounter for screening for other disorder: Secondary | ICD-10-CM

## 2019-07-01 DIAGNOSIS — E119 Type 2 diabetes mellitus without complications: Secondary | ICD-10-CM

## 2019-07-01 DIAGNOSIS — O10919 Unspecified pre-existing hypertension complicating pregnancy, unspecified trimester: Secondary | ICD-10-CM

## 2019-07-01 DIAGNOSIS — O10913 Unspecified pre-existing hypertension complicating pregnancy, third trimester: Secondary | ICD-10-CM

## 2019-07-01 DIAGNOSIS — R8271 Bacteriuria: Secondary | ICD-10-CM

## 2019-07-01 DIAGNOSIS — Z331 Pregnant state, incidental: Secondary | ICD-10-CM

## 2019-07-01 DIAGNOSIS — O0993 Supervision of high risk pregnancy, unspecified, third trimester: Secondary | ICD-10-CM

## 2019-07-01 DIAGNOSIS — Z3A35 35 weeks gestation of pregnancy: Secondary | ICD-10-CM

## 2019-07-01 DIAGNOSIS — O0990 Supervision of high risk pregnancy, unspecified, unspecified trimester: Secondary | ICD-10-CM

## 2019-07-01 LAB — POCT URINALYSIS DIPSTICK OB
Blood, UA: NEGATIVE
Glucose, UA: NEGATIVE
Ketones, UA: NEGATIVE
Nitrite, UA: NEGATIVE

## 2019-07-01 MED ORDER — NEBIVOLOL HCL 5 MG PO TABS: 5.0000 mg | ORAL_TABLET | Freq: Every day | ORAL | 0 refills | Status: DC

## 2019-07-01 NOTE — Progress Notes (Signed)
HIGH-RISK PREGNANCY VISIT Patient name: Mary Hardy MRN [id number]  Date of birth: Dec 16, 1980 Chief Complaint:   High Risk Gestation (NST; ? Braxton Hick's contractions; morning sickness this am; sees spots at times; belly feels sore )  History of Present Illness:   Mary Hardy is a 38 y.o. [phone] female at [redacted]w[redacted]d with an Estimated Date of Delivery: 08/02/19 being seen today for ongoing management of a high-risk pregnancy complicated by [facility] currently on bystolic 5mg  daily, Overt DM currently on glyburide 2.5mg  BID and metformin 500mg  AM, h/o eclampsia and pp pre-e Today she reports FBS 56, 98, 120, 69; 2hr pp 110-269 (only 1 <120). Headache last night, took baby ASA and it went away. Seeing spots last night and this am. No ruq/epigastric pain. Had n/v this am, better now. Contractions this am, belly feels sore. Home bp's 113-129/81-91. Normal pre-e labs Tuesday.  Contractions: Irregular. Vag. Bleeding: None.  Movement: Present. denies leaking of fluid.  Review of Systems:   Pertinent items are noted in HPI Denies abnormal vaginal discharge w/ itching/odor/irritation, headaches, visual changes, shortness of breath, chest pain, abdominal pain, severe nausea/vomiting, or problems with urination or bowel movements unless otherwise stated above. Pertinent History Reviewed:  Reviewed past medical,surgical, social, obstetrical and family history.  Reviewed problem list, medications and allergies. Physical Assessment:   Vitals:   07/01/19 1207 07/01/19 1211  BP: (!) 131/95 (!) 129/92  Pulse: 89 91  Weight: 207 lb 8 oz (94.1 kg)   Body mass index is 37.95 kg/m.           Physical Examination:   General appearance: alert, well appearing, and in no distress  Mental status: alert, oriented to person, place, and time  Skin: warm & dry   Extremities: Edema: Trace    Cardiovascular: normal heart rate noted  Respiratory: normal respiratory effort, no distress  Abdomen: gravid, soft, non-tender  Pelvic: Cervical exam deferred         Fetal Status: Fetal Heart Rate (bpm): 135 u/s   Movement: Present    Fetal Surveillance Testing today: NST: FHR baseline 135 bpm, Variability: moderate, Accelerations:present, Decelerations:  Absent= Cat 1/Reactive Toco: irregular     Results for orders placed or performed in visit on 07/01/19 (from the past 24 hour(s))  POC Urinalysis Dipstick OB   Collection Time: 07/01/19 12:08 PM  Result Value Ref Range   Color, UA     Clarity, UA     Glucose, UA Negative Negative   Bilirubin, UA     Ketones, UA neg    Spec Grav, UA     Blood, UA neg    pH, UA     POC,PROTEIN,UA Small (1+) Negative, Trace, Small (1+), Moderate (2+), Large (3+), 4+   Urobilinogen, UA     Nitrite, UA neg    Leukocytes, UA Trace (A) Negative   Appearance     Odor      Assessment & Plan:  1) High-risk pregnancy [id number] at [redacted]w[redacted]d with an Estimated Date of Delivery: 08/02/19   2) CHTN, stable on bystolic 5mg  daily, refilled today  3) Overt DM, dx @ 29wks, unstable. Reviewed sugars w/ LHE, increase am glyburide to 5mg  in AM, continue 2.5mg  pm-take at bedtime, continue metformin 500mg  AM  4) H/O eclampsia and pp pre-e> on ASA, mild headache last night, some seeing spots, normal pre-e labs Tues, bp's stable today, 1+ proteinuria which is what she's been running. Discussed w/ LHE, no further work-up needed right now. Reviewed pre-e s/s,  reasons to seek care. Check BPs QID at home, reviewed severe range bp's  5) Breech  Meds:  Meds ordered this encounter  Medications  . nebivolol (BYSTOLIC) 5 MG tablet    Sig: Take 1 tablet (5 mg total) by mouth daily.    Dispense:  30 tablet    Refill:  0    Order Specific Question:   Supervising Provider    Answer:   Lazaro ArmsEURE, LUTHER H [[id number]]    Labs/procedures today: nst  Treatment Plan:  2x/wk testing, IOL @ 39wks  Reviewed: Preterm labor symptoms and general obstetric precautions including but not limited to vaginal bleeding,  contractions, leaking of fluid and fetal movement were reviewed in detail with the patient.  All questions were answered.   Follow-up: Return for As scheduled Tues.  Orders Placed This Encounter  Procedures  . US OB Follow Up  . US FETAL BPP WO NON STRESS  . US UA Cord Doppler  . POC Urinalysis Dipstick OB   Cheral MarkerKimberly R Jadarian Mckay CNM, [facility]NP-BC 07/01/2019 1:51 PM

## 2019-07-01 NOTE — Patient Instructions (Signed)
Mary Hardy, I greatly value your feedback.  If you receive a survey following your visit with [place] today, we appreciate you taking the time to fill it out.  Thanks, Knute Neu, CNM, [facility]  [place]!!! It is now [place] at [facility] ([place], [place] [id number]) Entrance located off of [address] parking   Go to [email or web address] to register for FREE online childbirth classes    Call the office [phone]) or go to [facility] if:  You begin to have strong, frequent contractions  Your water breaks.  Sometimes it is a big gush of fluid, sometimes it is just a trickle that keeps getting your panties wet or running down your legs  You have vaginal bleeding.  It is normal to have a small amount of spotting if your cervix was checked.   You don't feel your baby moving like normal.  If you don't, get you something to eat and drink and lay down and focus on feeling your baby move.  You should feel at least 10 movements in 2 hours.  If you don't, you should call the office or go to [facility].   Call the office [phone]) or go to [facility] hospital for these signs of pre-eclampsia:  Severe headache that does not go away with Tylenol  Visual changes- seeing spots, double, blurred vision  Pain under your right breast or upper abdomen that does not go away with Tums or heartburn medicine  Nausea and/or vomiting  Severe swelling in your hands, feet, and face      Home Blood Pressure Monitoring for Patients   Your provider has recommended that you check your blood pressure (BP) at least once a week at home. If you do not have a blood pressure cuff at home, one will be provided for you. Contact your provider if you have not received your monitor within 1 week.   Helpful Tips for Accurate Home Blood Pressure Checks  . Don't smoke, exercise, or drink caffeine 30 minutes before checking your BP . Use the  restroom before checking your BP (a full bladder can raise your pressure) . Relax in a comfortable upright chair . Feet on the ground . Left arm resting comfortably on a flat surface at the level of your heart . Legs uncrossed . Back supported . Sit quietly and don't talk . Place the cuff on your bare arm . Adjust snuggly, so that only two fingertips can fit between your skin and the top of the cuff . Check 2 readings separated by at least one minute . Keep a log of your BP readings . For a visual, please reference this diagram: http://ccnc.care/bpdiagram  Provider Name: [facility]     Phone: [phone]  Zone 1: ALL CLEAR  Continue to monitor your symptoms:  . BP reading is less than 140 (top number) or less than 90 (bottom number)  . No right upper stomach pain . No headaches or seeing spots . No feeling nauseated or throwing up . No swelling in face and hands  Zone 2: CAUTION Call your doctor's office for any of the following:  . BP reading is greater than 140 (top number) or greater than 90 (bottom number)  . Stomach pain under your ribs in the middle or right side . Headaches or seeing spots . Feeling nauseated or throwing up . Swelling in face and hands  Zone 3: EMERGENCY  Seek immediate  medical care if you have any of the following:  . BP reading is greater than160 (top number) or greater than 110 (bottom number) . Severe headaches not improving with Tylenol . Serious difficulty catching your breath . Any worsening symptoms from Zone 2    Preeclampsia and Eclampsia Preeclampsia is a serious condition that may develop during pregnancy. This condition causes high blood pressure and increased protein in your urine along with other symptoms, such as headaches and vision changes. These symptoms may develop as the condition gets worse. Preeclampsia may occur at 20 weeks of pregnancy or later. Diagnosing and treating preeclampsia early is very important. If not  treated early, it can cause serious problems for you and your baby. One problem it can lead to is eclampsia. Eclampsia is a condition that causes muscle jerking or shaking (convulsions or seizures) and other serious problems for the mother. During pregnancy, delivering your baby may be the best treatment for preeclampsia or eclampsia. For most women, preeclampsia and eclampsia symptoms go away after giving birth. In rare cases, a woman may develop preeclampsia after giving birth (postpartum preeclampsia). This usually occurs within 48 hours after childbirth but may occur up to 6 weeks after giving birth. What are the causes? The cause of preeclampsia is not known. What increases the risk? The following risk factors make you more likely to develop preeclampsia:  Being pregnant for the first time.  Having had preeclampsia during a past pregnancy.  Having a family history of preeclampsia.  Having high blood pressure.  Being pregnant with more than one baby.  Being 41 or older.  Being African-American.  Having kidney disease or diabetes.  Having medical conditions such as lupus or blood diseases.  Being very overweight (obese). What are the signs or symptoms? The most common symptoms are:  Severe headaches.  Vision problems, such as blurred or double vision.  Abdominal pain, especially upper abdominal pain. Other symptoms that may develop as the condition gets worse include:  Sudden weight gain.  Sudden swelling of the hands, face, legs, and feet.  Severe nausea and vomiting.  Numbness in the face, arms, legs, and feet.  Dizziness.  Urinating less than usual.  Slurred speech.  Convulsions or seizures. How is this diagnosed? There are no screening tests for preeclampsia. Your health care provider will ask you about symptoms and check for signs of preeclampsia during your prenatal visits. You may also have tests that include:  Checking your blood pressure.  Urine  tests to check for protein. Your health care provider will check for this at every prenatal visit.  Blood tests.  Monitoring your baby's heart rate.  Ultrasound. How is this treated? You and your health care provider will determine the treatment approach that is best for you. Treatment may include:  Having more frequent prenatal exams to check for signs of preeclampsia, if you have an increased risk for preeclampsia.  Medicine to lower your blood pressure.  Staying in the hospital, if your condition is severe. There, treatment will focus on controlling your blood pressure and the amount of fluids in your body (fluid retention).  Taking medicine (magnesium sulfate) to prevent seizures. This may be given as an injection or through an IV.  Taking a low-dose aspirin during your pregnancy.  Delivering your baby early. You may have your labor started with medicine (induced), or you may have a cesarean delivery. Follow these instructions at home: Eating and drinking   Drink enough fluid to keep your urine pale  yellow.  Avoid caffeine. Lifestyle  Do not use any products that contain nicotine or tobacco, such as cigarettes and e-cigarettes. If you need help quitting, ask your health care provider.  Do not use alcohol or drugs.  Avoid stress as much as possible. Rest and get plenty of sleep. General instructions  Take over-the-counter and prescription medicines only as told by your health care provider.  When lying down, lie on your left side. This keeps pressure off your major blood vessels.  When sitting or lying down, raise (elevate) your feet. Try putting some pillows underneath your lower legs.  Exercise regularly. Ask your health care provider what kinds of exercise are best for you.  Keep all follow-up and prenatal visits as told by your health care provider. This is important. How is this prevented? There is no known way of preventing preeclampsia or eclampsia from  developing. However, to lower your risk of complications and detect problems early:  Get regular prenatal care. Your health care provider may be able to diagnose and treat the condition early.  Maintain a healthy weight. Ask your health care provider for help managing weight gain during pregnancy.  Work with your health care provider to manage any long-term (chronic) health conditions you have, such as diabetes or kidney problems.  You may have tests of your blood pressure and kidney function after giving birth.  Your health care provider may have you take low-dose aspirin during your next pregnancy. Contact a health care provider if:  You have symptoms that your health care provider told you may require more treatment or monitoring, such as: ? Headaches. ? Nausea or vomiting. ? Abdominal pain. ? Dizziness. ? Light-headedness. Get help right away if:  You have severe: ? Abdominal pain. ? Headaches that do not get better. ? Dizziness. ? Vision problems. ? Confusion. ? Nausea or vomiting.  You have any of the following: ? A seizure. ? Sudden, rapid weight gain. ? Sudden swelling in your hands, ankles, or face. ? Trouble moving any part of your body. ? Numbness in any part of your body. ? Trouble speaking. ? Abnormal bleeding.  You faint. Summary  Preeclampsia is a serious condition that may develop during pregnancy.  This condition causes high blood pressure and increased protein in your urine along with other symptoms, such as headaches and vision changes.  Diagnosing and treating preeclampsia early is very important. If not treated early, it can cause serious problems for you and your baby.  Get help right away if you have symptoms that your health care provider told you to watch for. This information is not intended to replace advice given to you by your health care provider. Make sure you discuss any questions you have with your health care provider. Document  Released: 11/07/2000 Document Revised: 07/13/2018 Document Reviewed: 06/16/2016 Elsevier Patient Education  2020 [employer].

## 2019-07-05 ENCOUNTER — Other Ambulatory Visit: Payer: Self-pay

## 2019-07-05 ENCOUNTER — Ambulatory Visit (INDEPENDENT_AMBULATORY_CARE_PROVIDER_SITE_OTHER): Payer: No Typology Code available for payment source | Admitting: Obstetrics & Gynecology

## 2019-07-05 ENCOUNTER — Ambulatory Visit (INDEPENDENT_AMBULATORY_CARE_PROVIDER_SITE_OTHER): Payer: No Typology Code available for payment source

## 2019-07-05 VITALS — BP 119/87 | HR 94 | Wt 207.5 lb

## 2019-07-05 DIAGNOSIS — O24419 Gestational diabetes mellitus in pregnancy, unspecified control: Secondary | ICD-10-CM

## 2019-07-05 DIAGNOSIS — O0993 Supervision of high risk pregnancy, unspecified, third trimester: Secondary | ICD-10-CM | POA: Diagnosis not present

## 2019-07-05 DIAGNOSIS — Z3A36 36 weeks gestation of pregnancy: Secondary | ICD-10-CM

## 2019-07-05 DIAGNOSIS — Z331 Pregnant state, incidental: Secondary | ICD-10-CM

## 2019-07-05 DIAGNOSIS — O10913 Unspecified pre-existing hypertension complicating pregnancy, third trimester: Secondary | ICD-10-CM

## 2019-07-05 DIAGNOSIS — O0990 Supervision of high risk pregnancy, unspecified, unspecified trimester: Secondary | ICD-10-CM

## 2019-07-05 DIAGNOSIS — O10919 Unspecified pre-existing hypertension complicating pregnancy, unspecified trimester: Secondary | ICD-10-CM

## 2019-07-05 DIAGNOSIS — E119 Type 2 diabetes mellitus without complications: Secondary | ICD-10-CM

## 2019-07-05 DIAGNOSIS — Z1389 Encounter for screening for other disorder: Secondary | ICD-10-CM

## 2019-07-05 LAB — POCT URINALYSIS DIPSTICK OB
Blood, UA: NEGATIVE
Glucose, UA: NEGATIVE
Leukocytes, UA: NEGATIVE
Nitrite, UA: NEGATIVE

## 2019-07-05 NOTE — Progress Notes (Signed)
[place] 36 wks,cephalic,anterior placenta gr 3,fhr 148 bpm,BPP 8/8,RI .63,.67,.69,.65=88%,AFI 13 cm,efw 3161 g 82%

## 2019-07-05 NOTE — Progress Notes (Signed)
   HIGH-RISK PREGNANCY VISIT Patient name: Mary Hardy MRN [id number]  Date of birth: [birth year] Chief Complaint:   Routine Prenatal Visit  History of Present Illness:   Mary Hardy is a 38 y.o. [phone] female at [redacted]w[redacted]d with an Estimated Date of Delivery: 08/02/19 being seen today for ongoing management of a high-risk pregnancy complicated by [facility] on bystolic 5 mg daily, metformin 500 mg at breakfast, glyburide 5 mg in am and 2.5 mg at bedtime Today she reports no complaints. Contractions: Irregular. Vag. Bleeding: None.  Movement: Present. denies leaking of fluid.  Review of Systems:   Pertinent items are noted in HPI Denies abnormal vaginal discharge w/ itching/odor/irritation, headaches, visual changes, shortness of breath, chest pain, abdominal pain, severe nausea/vomiting, or problems with urination or bowel movements unless otherwise stated above. Pertinent History Reviewed:  Reviewed past medical,surgical, social, obstetrical and family history.  Reviewed problem list, medications and allergies. Physical Assessment:   Vitals:   07/05/19 1550  BP: 119/87  Pulse: 94  Weight: 207 lb 8 oz (94.1 kg)  Body mass index is 37.95 kg/m.           Physical Examination:   General appearance: alert, well appearing, and in no distress  Mental status: alert, oriented to person, place, and time  Skin: warm & dry   Extremities: Edema: Trace    Cardiovascular: normal heart rate noted  Respiratory: normal respiratory effort, no distress  Abdomen: gravid, soft, non-tender  Pelvic: Cervical exam deferred         Fetal Status:     Movement: Present    Fetal Surveillance Testing today: BPP 8/8   Results for orders placed or performed in visit on 07/05/19 (from the past 24 hour(s))  POC Urinalysis Dipstick OB   Collection Time: 07/05/19  4:09 PM  Result Value Ref Range   Color, UA     Clarity, UA     Glucose, UA Negative Negative   Bilirubin, UA     Ketones, UA mod    Spec Grav, UA     Blood, UA neg    pH, UA     POC,PROTEIN,UA Trace Negative, Trace, Small (1+), Moderate (2+), Large (3+), 4+   Urobilinogen, UA     Nitrite, UA neg    Leukocytes, UA Negative Negative   Appearance     Odor      Assessment & Plan:  1) High-risk pregnancy [id number] at [redacted]w[redacted]d with an Estimated Date of Delivery: 08/02/19   2) CHTN, stable, bystolic 5 mg daily  3) Class A2 DM, stable, CBG much better moving the glyburide to bedtime, also on metformin 500 in am and glyburide 5 mg in am  Meds: No orders of the defined types were placed in this encounter.   Labs/procedures today: sonogram   Treatment Plan:  Continue twice weekly surveillance BPP/Dopplers + NST, IOL 39 weeks or as indicated  Reviewed: Term labor symptoms and general obstetric precautions including but not limited to vaginal bleeding, contractions, leaking of fluid and fetal movement were reviewed in detail with the patient.  All questions were answered. Has home bp cuff.  Check bp 4 x per day, let us know if >140/90.   Follow-up: Return in about 3 days (around 07/08/2019) for NST, HROB.  Orders Placed This Encounter  Procedures  . POC Urinalysis Dipstick OB   Florian Buff  07/05/2019 4:23 PM

## 2019-07-08 ENCOUNTER — Other Ambulatory Visit: Payer: Self-pay

## 2019-07-08 ENCOUNTER — Ambulatory Visit (INDEPENDENT_AMBULATORY_CARE_PROVIDER_SITE_OTHER): Payer: No Typology Code available for payment source | Admitting: *Deleted

## 2019-07-08 ENCOUNTER — Encounter (HOSPITAL_COMMUNITY): Payer: Self-pay | Admitting: *Deleted

## 2019-07-08 ENCOUNTER — Inpatient Hospital Stay (HOSPITAL_COMMUNITY)
Admission: AD | Admit: 2019-07-08 | Discharge: 2019-07-08 | Disposition: A | Discharge status: Home / Self Care | Payer: No Typology Code available for payment source | Attending: Obstetrics & Gynecology | Admitting: Obstetrics & Gynecology

## 2019-07-08 VITALS — BP 137/98 | HR 80 | Wt 210.0 lb

## 2019-07-08 DIAGNOSIS — R8271 Bacteriuria: Secondary | ICD-10-CM

## 2019-07-08 DIAGNOSIS — H539 Unspecified visual disturbance: Secondary | ICD-10-CM | POA: Insufficient documentation

## 2019-07-08 DIAGNOSIS — Z1389 Encounter for screening for other disorder: Secondary | ICD-10-CM

## 2019-07-08 DIAGNOSIS — O0993 Supervision of high risk pregnancy, unspecified, third trimester: Secondary | ICD-10-CM

## 2019-07-08 DIAGNOSIS — O10913 Unspecified pre-existing hypertension complicating pregnancy, third trimester: Secondary | ICD-10-CM

## 2019-07-08 DIAGNOSIS — Z3A36 36 weeks gestation of pregnancy: Secondary | ICD-10-CM | POA: Diagnosis not present

## 2019-07-08 DIAGNOSIS — O10013 Pre-existing essential hypertension complicating pregnancy, third trimester: Secondary | ICD-10-CM | POA: Diagnosis not present

## 2019-07-08 DIAGNOSIS — O10919 Unspecified pre-existing hypertension complicating pregnancy, unspecified trimester: Secondary | ICD-10-CM

## 2019-07-08 DIAGNOSIS — O0990 Supervision of high risk pregnancy, unspecified, unspecified trimester: Secondary | ICD-10-CM

## 2019-07-08 DIAGNOSIS — Z331 Pregnant state, incidental: Secondary | ICD-10-CM

## 2019-07-08 LAB — POCT URINALYSIS DIPSTICK OB
Blood, UA: NEGATIVE
Glucose, UA: NEGATIVE
Ketones, UA: NEGATIVE
Leukocytes, UA: NEGATIVE
Nitrite, UA: NEGATIVE

## 2019-07-08 LAB — CBC
HCT: 34.1 % — ABNORMAL LOW (ref 36.0–46.0)
Hemoglobin: 11.8 g/dL — ABNORMAL LOW (ref 12.0–15.0)
MCH: 29.9 pg (ref 26.0–34.0)
MCHC: 34.6 g/dL (ref 30.0–36.0)
MCV: 86.5 fL (ref 80.0–100.0)
Platelets: 213 10*3/uL (ref 150–400)
RBC: 3.94 MIL/uL (ref 3.87–5.11)
RDW: 14.2 % (ref 11.5–15.5)
WBC: 9.8 10*3/uL (ref 4.0–10.5)
nRBC: 0 % (ref 0.0–0.2)

## 2019-07-08 LAB — URINALYSIS, ROUTINE W REFLEX MICROSCOPIC
Bilirubin Urine: NEGATIVE
Glucose, UA: NEGATIVE mg/dL
Hgb urine dipstick: NEGATIVE
Ketones, ur: 5 mg/dL — AB
Nitrite: NEGATIVE
Protein, ur: 100 mg/dL — AB
Specific Gravity, Urine: 1.028 (ref 1.005–1.030)
WBC, UA: >50 WBC/hpf — ABNORMAL HIGH (ref 0–5)
pH: 5 (ref 5.0–8.0)

## 2019-07-08 LAB — COMPREHENSIVE METABOLIC PANEL
ALT: 23 U/L (ref 0–44)
AST: 19 U/L (ref 15–41)
Albumin: 2.7 g/dL — ABNORMAL LOW (ref 3.5–5.0)
Alkaline Phosphatase: 154 U/L — ABNORMAL HIGH (ref 38–126)
Anion gap: 11 (ref 5–15)
BUN: 11 mg/dL (ref 6–20)
CO2: 17 mmol/L — ABNORMAL LOW (ref 22–32)
Calcium: 9 mg/dL (ref 8.9–10.3)
Chloride: 109 mmol/L (ref 98–111)
Creatinine, Ser: 0.87 mg/dL (ref 0.44–1.00)
GFR calc Af Amer: >60 mL/min (ref >60)
GFR calc non Af Amer: >60 mL/min (ref >60)
Glucose, Bld: 126 mg/dL — ABNORMAL HIGH (ref 70–99)
Potassium: 4 mmol/L (ref 3.5–5.1)
Sodium: 137 mmol/L (ref 135–145)
Total Bilirubin: 0.5 mg/dL (ref 0.3–1.2)
Total Protein: 5.9 g/dL — ABNORMAL LOW (ref 6.5–8.1)

## 2019-07-08 LAB — PROTEIN / CREATININE RATIO, URINE
Creatinine, Urine: 301.46 mg/dL
Protein Creatinine Ratio: 0.28 mg/mg{Cre} — ABNORMAL HIGH (ref 0.00–0.15)
Total Protein, Urine: 83 mg/dL

## 2019-07-08 LAB — GLUCOSE, CAPILLARY: Glucose-Capillary: 112 mg/dL — ABNORMAL HIGH (ref 70–99)

## 2019-07-08 NOTE — Progress Notes (Signed)
Patient states she is just not feeling as good as she normally does.  Has not had any headaches.  Bp was slightly elevated this morning but patient states she did not sleep good last night.  Discussed BP readings with Dr Elonda Husky. Pt ok to go home.   Advised to continue to monitor blood pressures and if they continue to get higher or if she starts to develop a headache or visual changes, to go to [facility].  Pt verbalized understanding.

## 2019-07-08 NOTE — MAU Note (Addendum)
B/P has been high last 3 weeks. Feeling clamy, out of breath for short distances, hard to walk from pelvic pressure. B/P high today. If I get up too fast I see spots but no headache. Had eclampsia with first pregnancy. Had NST today and was reactive.

## 2019-07-08 NOTE — Discharge Instructions (Signed)

## 2019-07-08 NOTE — MAU Provider Note (Signed)
History     CSN: [id number]  Arrival date and time: 07/08/19 2036   First Provider Initiated Contact with Patient 07/08/19 2143      Chief Complaint  Patient presents with  . Hypertension    Mary Hardy is a 38 y.o. [id number] at 75w3dwho receives care at [facility].  She presents today for Hypertension.  She states she was seen in the office today with some noted elevations in blood pressure.  However, she was told to go home and continue to monitor and if she "was still feeling bad" to come in to be evaluated.  Patient states that she her blood pressures have been in the 130s-140s/100s.  She states that her blood pressures is usually in the 130s/80s. She reports SOB and inability to ambulate for extended periods of time without dizziness or lightheadedness. She also reports seeing spots that started yesterday and feeling "fatigued and clammy."  She further states that she only notices the spots when she is ambulating and they resolve with rest.  Patient currently denies all visual disturbances or pain. Patient endorses DM diagnosis and reports her BS was 109 prior to eating at 1800.  She reports drinking at least 5 bottles of water today.  Patient also reports taking her bp medication this morning. Patient endorses fetal movement and denies contractions, but reports "braxton things."  She further denies vaginal concerns including leaking, discharge, and bleeding.      OB History    Gravida  4   Para  1   Term      Preterm  1   AB  2   Living  1     SAB  2   TAB      Ectopic      Multiple      Live Births  1           Past Medical History:  Diagnosis Date  . Anemia   . Anxiety   . Chronic hypertension   . Depression   . Eclampsia (toxemia of pregnancy)   . History of eating disorder   . Incontinence of urine    due to nerve and muscle damage during child birth    Past Surgical History:  Procedure Laterality Date  . GALLBLADDER SURGERY    . MOUTH SURGERY      dental  . WISDOM TOOTH EXTRACTION      Family History  Problem Relation Age of Onset  . Glaucoma Paternal Grandfather   . Heart attack Paternal Grandfather   . Glaucoma Paternal Grandmother   . Heart attack Maternal Grandmother   . Heart disease Maternal Grandmother   . Diabetes Maternal Grandmother   . Heart attack Maternal Grandfather   . Heart disease Maternal Grandfather   . Carpal tunnel syndrome Mother   . Fibromyalgia Mother   . Heart disease Mother   . ADD / ADHD Daughter   . Cancer Maternal Aunt   . Heart attack Maternal Uncle     Social History   Tobacco Use  . Smoking status: Former Smoker    Types: E-cigarettes    Quit date: 03/21/2016    Years since quitting: 3.2  . Smokeless tobacco: Never Used  Substance Use Topics  . Alcohol use: Not Currently  . Drug use: Never    Allergies:  Allergies  Allergen Reactions  . Proanthocyanidin Anaphylaxis    Including grape juice, extracts, etc., reaction, throat gets scratchy, Pt gets short of breath, breaks out in rash  also.    Medications Prior to Admission  Medication Sig Dispense Refill Last Dose  . aspirin EC 81 MG tablet Take 162 mg by mouth daily.   07/08/2019 at Unknown time  . blood glucose meter kit and supplies Dispense based on patient and insurance preference. Check blood sugar four times daily as directed. (FOR ICD-10 E10.9, E11.9). 1 each 0 07/08/2019 at Unknown time  . Calcium Carbonate Antacid (TUMS PO) Take by mouth as needed.   07/08/2019 at Unknown time  . Famotidine (PEPCID AC PO) Take by mouth 2 (two) times a day.   07/08/2019 at Unknown time  . metFORMIN (GLUCOPHAGE) 500 MG tablet Take 1 tablet (500 mg total) by mouth daily with breakfast. 30 tablet 1 07/08/2019 at Unknown time  . nebivolol (BYSTOLIC) 5 MG tablet Take 1 tablet (5 mg total) by mouth daily. 30 tablet 0 07/08/2019 at Unknown time  . prenatal vitamin w/FE, FA (PRENATAL 1 + 1) 27-1 MG TABS tablet Take 1 tablet by mouth daily at 12 noon.  30 each 11 07/08/2019 at Unknown time  . glyBURIDE (DIABETA) 2.5 MG tablet Take 1 tablet (2.5 mg total) by mouth 2 (two) times daily with a meal. 60 tablet 3     Review of Systems  Constitutional: Negative for chills and fever.  Eyes: Positive for visual disturbance (Spotty Vision).  Respiratory: Positive for shortness of breath. Negative for cough.   Gastrointestinal: Positive for constipation. Negative for abdominal pain, diarrhea, nausea and vomiting.  Genitourinary: Positive for vaginal discharge (The usual white.). Negative for difficulty urinating, dysuria and vaginal bleeding.  Musculoskeletal: Negative for back pain.  Neurological: Positive for dizziness and light-headedness. Negative for headaches.   Physical Exam   Blood pressure (!) 140/93, pulse 93, temperature 98.6 F (37 C), resp. rate 18, height _0  (1.575 m), weight 95.7 kg, last menstrual period 10/13/2018, SpO2 100 %.  Vitals:   07/08/19 2201 07/08/19 2216 07/08/19 2231 07/08/19 2246  BP: 139/89 (!) 138/94 135/88 (!) 134/92  Pulse: (!) 103 91 94 89  Resp:      Temp:      SpO2:      Weight:      Height:        Physical Exam  Constitutional: She is oriented to person, place, and time. She appears well-developed and well-nourished. No distress.  HENT:  Head: Normocephalic and atraumatic.  Eyes: Conjunctivae are normal.  Neck: Normal range of motion.  Cardiovascular: Normal rate and regular rhythm.  Respiratory: Effort normal and breath sounds normal.  GI: Soft. Bowel sounds are normal.  Musculoskeletal: Normal range of motion.        General: No edema.  Neurological: She is alert and oriented to person, place, and time.  Skin: Skin is warm and dry.  Psychiatric: She has a normal mood and affect. Her behavior is normal.    Fetal Assessment 145 bpm, Mod Var, -Decels, +Accels Toco: None graphed  MAU Course   Results for orders placed or performed during the hospital encounter of 07/08/19 (from the past  24 hour(s))  Urinalysis, Routine w reflex microscopic     Status: Abnormal   Collection Time: 07/08/19  9:14 PM  Result Value Ref Range   Color, Urine AMBER (A) YELLOW   APPearance CLOUDY (A) CLEAR   Specific Gravity, Urine 1.028 1.005 - 1.030   pH 5.0 5.0 - 8.0   Glucose, UA NEGATIVE NEGATIVE mg/dL   Hgb urine dipstick NEGATIVE NEGATIVE   Bilirubin Urine  NEGATIVE NEGATIVE   Ketones, ur 5 (A) NEGATIVE mg/dL   Protein, ur 100 (A) NEGATIVE mg/dL   Nitrite NEGATIVE NEGATIVE   Leukocytes,Ua LARGE (A) NEGATIVE   RBC / HPF 11-20 0 - 5 RBC/hpf   WBC, UA >50 (H) 0 - 5 WBC/hpf   Bacteria, UA FEW (A) NONE SEEN   Squamous Epithelial / LPF 21-50 0 - 5   Mucus PRESENT    Ca Oxalate Crys, UA PRESENT   Protein / creatinine ratio, urine     Status: Abnormal   Collection Time: 07/08/19  9:14 PM  Result Value Ref Range   Creatinine, Urine 301.46 mg/dL   Total Protein, Urine 83 mg/dL   Protein Creatinine Ratio 0.28 (H) 0.00 - 0.15 mg/mg[Cre]  CBC     Status: Abnormal   Collection Time: 07/08/19  9:40 PM  Result Value Ref Range   WBC 9.8 4.0 - 10.5 K/uL   RBC 3.94 3.87 - 5.11 MIL/uL   Hemoglobin 11.8 (L) 12.0 - 15.0 g/dL   HCT 34.1 (L) 36.0 - 46.0 %   MCV 86.5 80.0 - 100.0 fL   MCH 29.9 26.0 - 34.0 pg   MCHC 34.6 30.0 - 36.0 g/dL   RDW 14.2 11.5 - 15.5 %   Platelets 213 150 - 400 K/uL   nRBC 0.0 0.0 - 0.2 %  Comprehensive metabolic panel     Status: Abnormal   Collection Time: 07/08/19  9:40 PM  Result Value Ref Range   Sodium 137 135 - 145 mmol/L   Potassium 4.0 3.5 - 5.1 mmol/L   Chloride 109 98 - 111 mmol/L   CO2 17 (L) 22 - 32 mmol/L   Glucose, Bld 126 (H) 70 - 99 mg/dL   BUN 11 6 - 20 mg/dL   Creatinine, Ser 0.87 0.44 - 1.00 mg/dL   Calcium 9.0 8.9 - 10.3 mg/dL   Total Protein 5.9 (L) 6.5 - 8.1 g/dL   Albumin 2.7 (L) 3.5 - 5.0 g/dL   AST 19 15 - 41 U/L   ALT 23 0 - 44 U/L   Alkaline Phosphatase 154 (H) 38 - 126 U/L   Total Bilirubin 0.5 0.3 - 1.2 mg/dL   GFR calc non Af Amer  >60 >60 mL/min   GFR calc Af Amer >60 >60 mL/min   Anion gap 11 5 - 15  Glucose, capillary     Status: Abnormal   Collection Time: 07/08/19 10:58 PM  Result Value Ref Range   Glucose-Capillary 112 (H) 70 - 99 mg/dL   No results found. . MDM PE Labs: CBC, CMP, PC Ratio EFM BP Q60mn Assessment and Plan  38year old [id number] SIUP at 36.3weeks Cat I FT Visual Disturbance CHTN  -Exam findings discussed. -Informed that would discuss admit vs discharge once results return. -Labs ordered -Will await results.    JMaryann ConnersMSN, CNM 07/08/2019, 9:43 PM   Reassessment (10:50 PM) PreEclampsia Labs WNL  -Dr. AHarolyn Rutherfordconsulted and informed of patient status and labs. *Instructed okay to discharge with follow up in office next week-Monday. *No need to increase bystolic as bp c/w in office pressures. -Patient informed of POC and reports she has an appt scheduled for Tuesday.  -Instructed to keep appt as scheduled and message would be sent to office to inform of recommendation for possible Monday follow up or well check. -Patient instructed to take bp daily until next office visit.  Discussed correct ways to take bp including after 118m of  rest, no talking, legs/ankles uncrossed. -Patient without questions or concerns. -Instructed to take medication as prescribed. -Labor Precautions. -Encouraged to call or return to MAU if symptoms worsen or with the onset of new symptoms. -Discharged to home in stable condition.  Maryann Conners MSN, CNM

## 2019-07-11 ENCOUNTER — Other Ambulatory Visit: Payer: Self-pay | Admitting: Obstetrics & Gynecology

## 2019-07-11 DIAGNOSIS — O24410 Gestational diabetes mellitus in pregnancy, diet controlled: Secondary | ICD-10-CM

## 2019-07-11 DIAGNOSIS — O10919 Unspecified pre-existing hypertension complicating pregnancy, unspecified trimester: Secondary | ICD-10-CM

## 2019-07-12 ENCOUNTER — Ambulatory Visit (INDEPENDENT_AMBULATORY_CARE_PROVIDER_SITE_OTHER): Payer: No Typology Code available for payment source

## 2019-07-12 ENCOUNTER — Encounter: Payer: Self-pay | Admitting: Obstetrics and Gynecology

## 2019-07-12 ENCOUNTER — Ambulatory Visit (INDEPENDENT_AMBULATORY_CARE_PROVIDER_SITE_OTHER): Payer: No Typology Code available for payment source | Admitting: Obstetrics and Gynecology

## 2019-07-12 ENCOUNTER — Other Ambulatory Visit: Payer: Self-pay

## 2019-07-12 VITALS — BP 144/93 | HR 94 | Wt 212.0 lb

## 2019-07-12 DIAGNOSIS — Z3A37 37 weeks gestation of pregnancy: Secondary | ICD-10-CM | POA: Diagnosis not present

## 2019-07-12 DIAGNOSIS — E119 Type 2 diabetes mellitus without complications: Secondary | ICD-10-CM

## 2019-07-12 DIAGNOSIS — O10913 Unspecified pre-existing hypertension complicating pregnancy, third trimester: Secondary | ICD-10-CM

## 2019-07-12 DIAGNOSIS — O0993 Supervision of high risk pregnancy, unspecified, third trimester: Secondary | ICD-10-CM

## 2019-07-12 DIAGNOSIS — O10919 Unspecified pre-existing hypertension complicating pregnancy, unspecified trimester: Secondary | ICD-10-CM

## 2019-07-12 DIAGNOSIS — O24410 Gestational diabetes mellitus in pregnancy, diet controlled: Secondary | ICD-10-CM | POA: Diagnosis not present

## 2019-07-12 DIAGNOSIS — O0990 Supervision of high risk pregnancy, unspecified, unspecified trimester: Secondary | ICD-10-CM

## 2019-07-12 DIAGNOSIS — Z1389 Encounter for screening for other disorder: Secondary | ICD-10-CM

## 2019-07-12 DIAGNOSIS — Z3483 Encounter for supervision of other normal pregnancy, third trimester: Secondary | ICD-10-CM

## 2019-07-12 DIAGNOSIS — Z331 Pregnant state, incidental: Secondary | ICD-10-CM

## 2019-07-12 LAB — POCT URINALYSIS DIPSTICK OB
Blood, UA: NEGATIVE
Glucose, UA: NEGATIVE
Ketones, UA: NEGATIVE
Leukocytes, UA: NEGATIVE
Nitrite, UA: NEGATIVE

## 2019-07-12 NOTE — Progress Notes (Signed)
[place] 37 wks,cephalic,anterior placenta gr 3,normal ovaries bilat,BPP 8/8,AFI 9.4 cm,fhr 127 bpm,RI .62,.72,.69=89%

## 2019-07-12 NOTE — Progress Notes (Signed)
HIGH-RISK PREGNANCY VISIT Patient name: Mary Hardy MRN [id number]  Date of birth: Jun 09, 1981 Chief Complaint:   Routine Prenatal Visit (BPP)  History of Present Illness:   Mary Hardy is a 38 y.o. [phone] female at 3534w0d with an Estimated Date of Delivery: 08/02/19 being seen today for ongoing management of a high-risk pregnancy complicated by A2DM currently on metformin, glyburide, plus CHTN on bystolic 5 Today she reports no complaints and good FM. Contractions: Irregular. Vag. Bleeding: None.  Movement: Present. denies leaking of fluid.  Review of Systems:   Pertinent items are noted in HPI Denies abnormal vaginal discharge w/ itching/odor/irritation, headaches, visual changes, shortness of breath, chest pain, abdominal pain, severe nausea/vomiting, or problems with urination or bowel movements unless otherwise stated above. Pertinent History Reviewed:  Reviewed past medical,surgical, social, obstetrical and family history.  Reviewed problem list, medications and allergies. Physical Assessment:     Vitals:   07/12/19 1557  BP: (!) 144/93  Pulse: 94  Weight: 212 lb (96.2 kg)  Body mass index is 38.78 kg/m.           Physical Examination:   General appearance: alert, well appearing, and in no distress, oriented to person, place, and time, overweight and playful, active  Mental status: alert, oriented to person, place, and time, normal mood, behavior, speech, dress, motor activity, and thought processes  Skin: warm & dry   Extremities: Edema: Trace    Cardiovascular: normal heart rate noted  Respiratory: normal respiratory effort, no distress  Abdomen: gravid, soft, non-tender  Pelvic: Cervical exam deferred         Fetal Status:     Movement: Present    Fetal Surveillance Testing today: u/s BPP dopplers  TECHNICIAN COMMENTS:  US 37 wks,cephalic,anterior placenta gr 3,normal ovaries bilat,BPP 8/8,AFI 9.4 cm,fhr 127 bpm,RI .62,.72,.69=89%   A copy of this report  including all images has been saved and backed up to a second source for retrieval if needed. All measures and details of the anatomical scan, placentation, fluid volume and pelvic anatomy are contained in that report.  Amber J Baldo AshCarl 07/12/2019 4:04 PM   BPP 8/8, dopplers 89 %ile on RI. Last week was 88%   Results for orders placed or performed in visit on 07/12/19 (from the past 24 hour(s))  POC Urinalysis Dipstick OB   Collection Time: 07/12/19  4:03 PM  Result Value Ref Range   Color, UA     Clarity, UA     Glucose, UA Negative Negative   Bilirubin, UA     Ketones, UA neg    Spec Grav, UA     Blood, UA neg    pH, UA     POC,PROTEIN,UA Small (1+) Negative, Trace, Small (1+), Moderate (2+), Large (3+), 4+   Urobilinogen, UA     Nitrite, UA neg    Leukocytes, UA Negative Negative   Appearance     Odor      Assessment & Plan:  1) High-risk pregnancy [id number] at 5834w0d with an Estimated Date of Delivery: 08/02/19  Dopplers approaching 90%ile, would consider IOL q visit if dopplers over 90% or  AEDF 2) CHTN on bystolic 5, stable  3) A2 DM on metformin and glyburide 2.5, stable  Meds: No orders of the defined types were placed in this encounter.   Labs/procedures today: u/s bpp dopplers  Treatment Plan:  Continue current meds,   IOL at 39wk or earlier prn if worsening dopplers or fetal status.  Reviewed: Term labor symptoms  and general obstetric precautions including but not limited to vaginal bleeding, contractions, leaking of fluid and fetal movement were reviewed in detail with the patient.  All questions were answered.  home bp cuff. . Check bp dailyy, let us know if numbers increase from current levels.   Follow-up: No follow-ups on file.  Orders Placed This Encounter  Procedures  . GC/Chlamydia Probe Amp  . POC Urinalysis Dipstick OB   Jonnie Kind md 07/12/2019 5:06 PM

## 2019-07-13 ENCOUNTER — Telehealth: Payer: Self-pay | Admitting: Obstetrics & Gynecology

## 2019-07-13 ENCOUNTER — Telehealth: Payer: Self-pay | Admitting: *Deleted

## 2019-07-13 ENCOUNTER — Encounter (HOSPITAL_COMMUNITY): Payer: Self-pay

## 2019-07-13 ENCOUNTER — Other Ambulatory Visit: Payer: Self-pay

## 2019-07-13 ENCOUNTER — Inpatient Hospital Stay (HOSPITAL_COMMUNITY)
Admission: AD | Admit: 2019-07-13 | Discharge: 2019-07-18 | DRG: 807 | Disposition: A | Discharge status: Home / Self Care | Payer: No Typology Code available for payment source | Attending: Obstetrics and Gynecology | Admitting: Obstetrics and Gynecology

## 2019-07-13 DIAGNOSIS — O139 Gestational [pregnancy-induced] hypertension without significant proteinuria, unspecified trimester: Secondary | ICD-10-CM | POA: Diagnosis present

## 2019-07-13 DIAGNOSIS — O2412 Pre-existing diabetes mellitus, type 2, in childbirth: Secondary | ICD-10-CM | POA: Diagnosis not present

## 2019-07-13 DIAGNOSIS — O24425 Gestational diabetes mellitus in childbirth, controlled by oral hypoglycemic drugs: Secondary | ICD-10-CM | POA: Diagnosis present

## 2019-07-13 DIAGNOSIS — O99824 Streptococcus B carrier state complicating childbirth: Secondary | ICD-10-CM | POA: Diagnosis present

## 2019-07-13 DIAGNOSIS — O1213 Gestational proteinuria, third trimester: Secondary | ICD-10-CM | POA: Diagnosis not present

## 2019-07-13 DIAGNOSIS — E119 Type 2 diabetes mellitus without complications: Secondary | ICD-10-CM | POA: Diagnosis not present

## 2019-07-13 DIAGNOSIS — O133 Gestational [pregnancy-induced] hypertension without significant proteinuria, third trimester: Secondary | ICD-10-CM

## 2019-07-13 DIAGNOSIS — Z3A37 37 weeks gestation of pregnancy: Secondary | ICD-10-CM

## 2019-07-13 DIAGNOSIS — Z30017 Encounter for initial prescription of implantable subdermal contraceptive: Secondary | ICD-10-CM | POA: Diagnosis not present

## 2019-07-13 DIAGNOSIS — O24419 Gestational diabetes mellitus in pregnancy, unspecified control: Secondary | ICD-10-CM

## 2019-07-13 DIAGNOSIS — O1002 Pre-existing essential hypertension complicating childbirth: Secondary | ICD-10-CM | POA: Diagnosis present

## 2019-07-13 DIAGNOSIS — Z20828 Contact with and (suspected) exposure to other viral communicable diseases: Secondary | ICD-10-CM | POA: Diagnosis present

## 2019-07-13 DIAGNOSIS — O114 Pre-existing hypertension with pre-eclampsia, complicating childbirth: Principal | ICD-10-CM | POA: Diagnosis present

## 2019-07-13 DIAGNOSIS — O1210 Gestational proteinuria, unspecified trimester: Secondary | ICD-10-CM | POA: Diagnosis present

## 2019-07-13 DIAGNOSIS — R8271 Bacteriuria: Secondary | ICD-10-CM | POA: Diagnosis present

## 2019-07-13 DIAGNOSIS — Z87891 Personal history of nicotine dependence: Secondary | ICD-10-CM

## 2019-07-13 DIAGNOSIS — O1490 Unspecified pre-eclampsia, unspecified trimester: Secondary | ICD-10-CM

## 2019-07-13 DIAGNOSIS — O1404 Mild to moderate pre-eclampsia, complicating childbirth: Secondary | ICD-10-CM | POA: Diagnosis not present

## 2019-07-13 DIAGNOSIS — O10919 Unspecified pre-existing hypertension complicating pregnancy, unspecified trimester: Secondary | ICD-10-CM

## 2019-07-13 LAB — COMPREHENSIVE METABOLIC PANEL
ALT: 24 U/L (ref 0–44)
AST: 22 U/L (ref 15–41)
Albumin: 2.6 g/dL — ABNORMAL LOW (ref 3.5–5.0)
Alkaline Phosphatase: 166 U/L — ABNORMAL HIGH (ref 38–126)
Anion gap: 10 (ref 5–15)
BUN: 10 mg/dL (ref 6–20)
CO2: 16 mmol/L — ABNORMAL LOW (ref 22–32)
Calcium: 9.5 mg/dL (ref 8.9–10.3)
Chloride: 109 mmol/L (ref 98–111)
Creatinine, Ser: 0.72 mg/dL (ref 0.44–1.00)
GFR calc Af Amer: >60 mL/min (ref >60)
GFR calc non Af Amer: >60 mL/min (ref >60)
Glucose, Bld: 61 mg/dL — ABNORMAL LOW (ref 70–99)
Potassium: 4.2 mmol/L (ref 3.5–5.1)
Sodium: 135 mmol/L (ref 135–145)
Total Bilirubin: 0.3 mg/dL (ref 0.3–1.2)
Total Protein: 5.8 g/dL — ABNORMAL LOW (ref 6.5–8.1)

## 2019-07-13 LAB — URINALYSIS, ROUTINE W REFLEX MICROSCOPIC
Bilirubin Urine: NEGATIVE
Glucose, UA: NEGATIVE mg/dL
Ketones, ur: NEGATIVE mg/dL
Nitrite: NEGATIVE
Protein, ur: 100 mg/dL — AB
Specific Gravity, Urine: 1.016 (ref 1.005–1.030)
Squamous Epithelial / LPF: >50 — ABNORMAL HIGH (ref 0–5)
WBC, UA: >50 WBC/hpf — ABNORMAL HIGH (ref 0–5)
pH: 7 (ref 5.0–8.0)

## 2019-07-13 LAB — CBC
HCT: 36.5 % (ref 36.0–46.0)
Hemoglobin: 12.2 g/dL (ref 12.0–15.0)
MCH: 28.6 pg (ref 26.0–34.0)
MCHC: 33.4 g/dL (ref 30.0–36.0)
MCV: 85.7 fL (ref 80.0–100.0)
Platelets: 186 10*3/uL (ref 150–400)
RBC: 4.26 MIL/uL (ref 3.87–5.11)
RDW: 14.1 % (ref 11.5–15.5)
WBC: 10 10*3/uL (ref 4.0–10.5)
nRBC: 0.2 % (ref 0.0–0.2)

## 2019-07-13 LAB — PROTEIN / CREATININE RATIO, URINE
Creatinine, Urine: 128.15 mg/dL
Creatinine, Urine: 129.77 mg/dL
Protein Creatinine Ratio: 0.5 mg/mg{Cre} — ABNORMAL HIGH (ref 0.00–0.15)
Protein Creatinine Ratio: 0.5 mg/mg{Cre} — ABNORMAL HIGH (ref 0.00–0.15)
Total Protein, Urine: 64 mg/dL
Total Protein, Urine: 65 mg/dL

## 2019-07-13 LAB — GLUCOSE, CAPILLARY: Glucose-Capillary: 99 mg/dL (ref 70–99)

## 2019-07-13 LAB — TYPE AND SCREEN
ABO/RH(D): A POS
Antibody Screen: NEGATIVE

## 2019-07-13 LAB — ABO/RH: ABO/RH(D): A POS

## 2019-07-13 LAB — SARS CORONAVIRUS 2 BY RT PCR (HOSPITAL ORDER, PERFORMED IN ~~LOC~~ HOSPITAL LAB): SARS Coronavirus 2: NEGATIVE

## 2019-07-13 MED ORDER — DOCUSATE SODIUM 100 MG PO CAPS: 100.0000 mg | ORAL_CAPSULE | Freq: Every day | ORAL | Status: DC

## 2019-07-13 MED ORDER — GLYBURIDE 2.5 MG PO TABS
2.5000 mg | ORAL_TABLET | Freq: Every day | ORAL | Status: DC
  Administered 2019-07-14 – 2019-07-15 (×2): 2.5 mg via ORAL
  Filled 2019-07-13 (×3): qty 1

## 2019-07-13 MED ORDER — ACETAMINOPHEN 325 MG PO TABS
650.0000 mg | ORAL_TABLET | ORAL | Status: DC | PRN
  Administered 2019-07-14: 650 mg via ORAL
  Filled 2019-07-13: qty 2

## 2019-07-13 MED ORDER — BUTALBITAL-APAP-CAFFEINE 50-325-40 MG PO TABS: 2.0000 | ORAL_TABLET | Freq: Four times a day (QID) | ORAL | Status: DC | PRN

## 2019-07-13 MED ORDER — NEBIVOLOL HCL 5 MG PO TABS
5.0000 mg | ORAL_TABLET | Freq: Every day | ORAL | Status: DC
  Administered 2019-07-14 – 2019-07-15 (×2): 5 mg via ORAL
  Filled 2019-07-13 (×3): qty 1

## 2019-07-13 MED ORDER — ZOLPIDEM TARTRATE 5 MG PO TABS
5.0000 mg | ORAL_TABLET | Freq: Every evening | ORAL | Status: DC | PRN
  Administered 2019-07-14: 23:00:00 5 mg via ORAL
  Filled 2019-07-13: qty 1

## 2019-07-13 MED ORDER — ACETAMINOPHEN 500 MG PO TABS
1000.0000 mg | ORAL_TABLET | Freq: Once | ORAL | Status: AC
  Administered 2019-07-13: 1000 mg via ORAL
  Filled 2019-07-13: qty 2

## 2019-07-13 MED ORDER — PRENATAL MULTIVITAMIN CH
1.0000 | ORAL_TABLET | Freq: Every day | ORAL | Status: DC
  Administered 2019-07-14 – 2019-07-15 (×2): 1 via ORAL
  Filled 2019-07-13 (×2): qty 1

## 2019-07-13 MED ORDER — METFORMIN HCL 500 MG PO TABS
500.0000 mg | ORAL_TABLET | Freq: Every day | ORAL | Status: DC
  Administered 2019-07-14 – 2019-07-15 (×2): 500 mg via ORAL
  Filled 2019-07-13 (×4): qty 1

## 2019-07-13 MED ORDER — DOCUSATE SODIUM 100 MG PO CAPS: 100.0000 mg | ORAL_CAPSULE | Freq: Two times a day (BID) | ORAL | Status: DC | PRN

## 2019-07-13 MED ORDER — CALCIUM CARBONATE ANTACID 500 MG PO CHEW
2.0000 | CHEWABLE_TABLET | ORAL | Status: DC | PRN
  Administered 2019-07-14 – 2019-07-15 (×2): 400 mg via ORAL
  Filled 2019-07-13 (×2): qty 2

## 2019-07-13 MED ORDER — GLYBURIDE 2.5 MG PO TABS
2.5000 mg | ORAL_TABLET | Freq: Two times a day (BID) | ORAL | Status: DC
  Filled 2019-07-13: qty 1

## 2019-07-13 NOTE — Telephone Encounter (Signed)
Patient states she is just not feeling well today. She has no appetite and has a mild headache that is not easing up even after taking Tylenol.  She is having some mild cramping along with lower back pain as well.  Pressures today are 140/150's over 90's and seems to "keep going up".  Advised patient to go to [facility] for evaluation based on her history an current symptoms.  Pt verbalized understanding and stated she would go.

## 2019-07-13 NOTE — MAU Note (Signed)
.   Mary Hardy is a 38 y.o. at [redacted]w[redacted]d here in MAU reporting: lower abdominal cramping, increase in blood pressure and headache  Onset of complaint: today Pain score: 3 Vitals:   07/13/19 1707  Pulse: 81  Resp: 16  Temp: 97.6 F (36.4 C)     FHT:146 Lab orders placed from triage: UA

## 2019-07-13 NOTE — Telephone Encounter (Signed)
PT is 37 weeks not feeling well at all today /Husband called for someone to call her as soon as we could/ Stomach cramps/low back pain/ nausea/

## 2019-07-13 NOTE — MAU Provider Note (Addendum)
History     CSN: [id number]  Arrival date and time: 07/13/19 1655   First Provider Initiated Contact with Patient 07/13/19 1735      Chief Complaint  Patient presents with  . Abdominal Pain  . Decreased Fetal Movement  . Headache   HPI Mary Hardy 38 y.o. [redacted]w[redacted]d Was seen in MAU on 07-08-19 (140/93) and discharged with chronic hypertension and diabetes taking metformin, diabeta and bystolic, but labs did not indicated preeclampsia.  Took her medications today.  Has felt bad since awakening today and has had a headache rated 3-6/10.  Took tylenol at 1 pm today and it did not relieve her headache as it usually does.  Today her blood pressure was increased at home and she came in for further evaluation.  Is noting lower abdominal cramping and still today does not feel well.  Had one elevated BP on arrival but then serial BPs have been normal.  Will draw labs. Will give Tylenol for headache as time is past for redosing.   OB History    Gravida  4   Para  1   Term      Preterm  1   AB  2   Living  1     SAB  2   TAB      Ectopic      Multiple      Live Births  1           Past Medical History:  Diagnosis Date  . Anemia   . Anxiety   . Chronic hypertension   . Depression   . Eclampsia (toxemia of pregnancy)   . History of eating disorder   . Incontinence of urine    due to nerve and muscle damage during child birth    Past Surgical History:  Procedure Laterality Date  . GALLBLADDER SURGERY    . MOUTH SURGERY     dental  . WISDOM TOOTH EXTRACTION      Family History  Problem Relation Age of Onset  . Glaucoma Paternal Grandfather   . Heart attack Paternal Grandfather   . Glaucoma Paternal Grandmother   . Heart attack Maternal Grandmother   . Heart disease Maternal Grandmother   . Diabetes Maternal Grandmother   . Heart attack Maternal Grandfather   . Heart disease Maternal Grandfather   . Carpal tunnel syndrome Mother   . Fibromyalgia Mother    . Heart disease Mother   . ADD / ADHD Daughter   . Cancer Maternal Aunt   . Heart attack Maternal Uncle     Social History   Tobacco Use  . Smoking status: Former Smoker    Types: E-cigarettes    Quit date: 03/21/2016    Years since quitting: 3.3  . Smokeless tobacco: Never Used  Substance Use Topics  . Alcohol use: Not Currently  . Drug use: Never    Allergies:  Allergies  Allergen Reactions  . Proanthocyanidin Anaphylaxis    Including grape juice, extracts, etc., reaction, throat gets scratchy, Pt gets short of breath, breaks out in rash also.    Medications Prior to Admission  Medication Sig Dispense Refill Last Dose  . acetaminophen (TYLENOL) 500 MG tablet Take 1,000 mg by mouth every 6 (six) hours as needed.   07/13/2019 at Unknown time  . aspirin EC 81 MG tablet Take 162 mg by mouth daily.   07/13/2019 at Unknown time  . blood glucose meter kit and supplies Dispense based on patient  and insurance preference. Check blood sugar four times daily as directed. (FOR ICD-10 E10.9, E11.9). 1 each 0 07/13/2019 at Unknown time  . Calcium Carbonate Antacid (TUMS PO) Take by mouth as needed.   Past Week at Unknown time  . Famotidine (PEPCID AC PO) Take by mouth 2 (two) times a day.   Past Week at Unknown time  . glyBURIDE (DIABETA) 2.5 MG tablet Take 1 tablet (2.5 mg total) by mouth 2 (two) times daily with a meal. 60 tablet 3 07/13/2019 at Unknown time  . metFORMIN (GLUCOPHAGE) 500 MG tablet Take 1 tablet (500 mg total) by mouth daily with breakfast. 30 tablet 1 07/13/2019 at Unknown time  . nebivolol (BYSTOLIC) 5 MG tablet Take 1 tablet (5 mg total) by mouth daily. 30 tablet 0 07/13/2019 at Unknown time  . prenatal vitamin w/FE, FA (PRENATAL 1 + 1) 27-1 MG TABS tablet Take 1 tablet by mouth daily at 12 noon. 30 each 11 07/13/2019 at Unknown time    Review of Systems  Constitutional: Negative for fever.  Eyes: Negative for visual disturbance.  Respiratory: Negative for cough and  shortness of breath.   Gastrointestinal: Positive for abdominal pain.  Genitourinary: Negative for dysuria, vaginal bleeding and vaginal discharge.  Neurological: Positive for dizziness and headaches.   Physical Exam   Blood pressure 120/87, pulse 91, temperature 97.6 F (36.4 C), resp. rate 16, weight 96.2 kg, last menstrual period 10/13/2018, SpO2 99 %. BP on arrival to MAU was 156/97  Physical Exam  Nursing note and vitals reviewed. Constitutional: She is oriented to person, place, and time. She appears well-developed and well-nourished.  HENT:  Head: Normocephalic.  Eyes: EOM are normal.  Neck: Neck supple.  GI: Soft. There is no abdominal tenderness. There is no rebound and no guarding.  No RUQ pain FHT baseline of 140 with moderate variability and 15x15 accels noted.  No decelerations and occasional contractions.  Reactive NST.  Musculoskeletal: Normal range of motion.        General: No edema.     Comments: No ankle edema  Neurological: She is alert and oriented to person, place, and time.  Skin: Skin is warm and dry.  Psychiatric: She has a normal mood and affect.   Care transferred to [facility]. Norman Herrlich, CNM at 6:25 pm  Results for orders placed or performed during the hospital encounter of 07/13/19 (from the past 24 hour(s))  Urinalysis, Routine w reflex microscopic     Status: Abnormal   Collection Time: 07/13/19  5:30 PM  Result Value Ref Range   Color, Urine AMBER (A) YELLOW   APPearance CLOUDY (A) CLEAR   Specific Gravity, Urine 1.016 1.005 - 1.030   pH 7.0 5.0 - 8.0   Glucose, UA NEGATIVE NEGATIVE mg/dL   Hgb urine dipstick SMALL (A) NEGATIVE   Bilirubin Urine NEGATIVE NEGATIVE   Ketones, ur NEGATIVE NEGATIVE mg/dL   Protein, ur 100 (A) NEGATIVE mg/dL   Nitrite NEGATIVE NEGATIVE   Leukocytes,Ua LARGE (A) NEGATIVE   RBC / HPF 11-20 0 - 5 RBC/hpf   WBC, UA >50 (H) 0 - 5 WBC/hpf   Bacteria, UA MANY (A) NONE SEEN   Squamous Epithelial / LPF >50 (H) 0 - 5   Mucus  PRESENT    Budding Yeast PRESENT    Non Squamous Epithelial 0-5 (A) NONE SEEN  Protein / creatinine ratio, urine     Status: Abnormal   Collection Time: 07/13/19  5:32 PM  Result Value Ref Range  Creatinine, Urine 129.77 mg/dL   Total Protein, Urine 65 mg/dL   Protein Creatinine Ratio 0.50 (H) 0.00 - 0.15 mg/mg[Cre]  Protein / creatinine ratio, urine     Status: Abnormal   Collection Time: 07/13/19  5:33 PM  Result Value Ref Range   Creatinine, Urine 128.15 mg/dL   Total Protein, Urine 64 mg/dL   Protein Creatinine Ratio 0.50 (H) 0.00 - 0.15 mg/mg[Cre]  CBC     Status: None   Collection Time: 07/13/19  5:52 PM  Result Value Ref Range   WBC 10.0 4.0 - 10.5 K/uL   RBC 4.26 3.87 - 5.11 MIL/uL   Hemoglobin 12.2 12.0 - 15.0 g/dL   HCT 36.5 36.0 - 46.0 %   MCV 85.7 80.0 - 100.0 fL   MCH 28.6 26.0 - 34.0 pg   MCHC 33.4 30.0 - 36.0 g/dL   RDW 14.1 11.5 - 15.5 %   Platelets 186 150 - 400 K/uL   nRBC 0.2 0.0 - 0.2 %  Comprehensive metabolic panel     Status: Abnormal   Collection Time: 07/13/19  5:52 PM  Result Value Ref Range   Sodium 135 135 - 145 mmol/L   Potassium 4.2 3.5 - 5.1 mmol/L   Chloride 109 98 - 111 mmol/L   CO2 16 (L) 22 - 32 mmol/L   Glucose, Bld 61 (L) 70 - 99 mg/dL   BUN 10 6 - 20 mg/dL   Creatinine, Ser 0.72 0.44 - 1.00 mg/dL   Calcium 9.5 8.9 - 10.3 mg/dL   Total Protein 5.8 (L) 6.5 - 8.1 g/dL   Albumin 2.6 (L) 3.5 - 5.0 g/dL   AST 22 15 - 41 U/L   ALT 24 0 - 44 U/L   Alkaline Phosphatase 166 (H) 38 - 126 U/L   Total Bilirubin 0.3 0.3 - 1.2 mg/dL   GFR calc non Af Amer >60 >60 mL/min   GFR calc Af Amer >60 >60 mL/min   Anion gap 10 5 - 15     MAU Course  Procedures  MDM 7:15 PM Consult with Dr. Ilda Basset, will obs and have patient do 24 hour urine here on OBSC. Induction plan pending labs and patient status.   Assessment and Plan   1. Hypertension in pregnancy, transient, antepartum, third trimester   2. [redacted] weeks gestation of pregnancy   3.  Proteinuria affecting pregnancy in third trimester    Admit to antenatal for 24 hour urine collection  [place], CNM  07/13/19  7:21 PM    Terri L Burleson 07/13/2019, 5:42 PM

## 2019-07-13 NOTE — H&P (Signed)
Attestation signed by Aletha Halim, MD at 07/13/2019 8:59 PM (Updated)  Attestation of Attending Supervision of Certified Nurse Midwife: Evaluation and management procedures were performed by the CNM under my supervision.  I have seen and examined the patient,  reviewed the CNM's note and chart, and I agree with the management and plan.   Unsure of diagnosis of pre-eclampsia. She states she's had a HA all day but hasn't eaten anything and CMP glucose in the 60s in MAU; she states she did take her glyburide 2.5 and metformin 500 with breakfast and she doesn't have any night time doses. Her HA was also helped with tylenol in MAU. Patient had PCR at 500 and had 1+ protein for her past few OB visits with prior PCR in the 200s. She denies any visual s/s or abdominal s/s of pre-eclampsia.   She did have eclampsia at 34wks in 2014 but wasn't on meds for her HTN and presented with eclampsia at home with BPs near the 200s and PCR at 10k, confirmed on 24h urine. She also had elevated CR to the low 1s.   Given her presentation, I won't proceed with delivery. Will repeat labs in AM and start 24 hour urine. Pt about to order dinner. If has true proteinuria, or severe range BPs or neuro s/s come back and unrelieved with meds, then I told her I'd proceed with delivery.  NAD; RRR, no MRGs; CTAB; 1+ brachial reflexes.   Durene Romans MD [facility]ending [facility])      Expand All Collapse All    History     CSN: [id number]  Arrival date and time: 07/13/19 1655   First Provider Initiated Contact with Patient 07/13/19 1735         Chief Complaint  Patient presents with  . Abdominal Pain  . Decreased Fetal Movement  . Headache   HPI Quenna Doepke 38 y.o. [redacted]w[redacted]d Was seen in MAU on 07-08-19 (140/93) and discharged with chronic hypertension and diabetes taking metformin, diabeta and bystolic, but labs did not indicated preeclampsia.  Took her medications  today.  Has felt bad since awakening today and has had a headache rated 3-6/10.  Took tylenol at 1 pm today and it did not relieve her headache as it usually does.  Today her blood pressure was increased at home and she came in for further evaluation.  Is noting lower abdominal cramping and still today does not feel well.  Had one elevated BP on arrival but then serial BPs have been normal.  Will draw labs. Will give Tylenol for headache as time is past for redosing.           OB History    Gravida  4   Para  1   Term      Preterm  1   AB  2   Living  1     SAB  2   TAB      Ectopic      Multiple      Live Births  1               Past Medical History:  Diagnosis Date  . Anemia   . Anxiety   . Chronic hypertension   . Depression   . Eclampsia (toxemia of pregnancy)   . History of eating disorder   . Incontinence of urine    due to nerve and muscle damage during child birth  Past Surgical History:  Procedure Laterality Date  . GALLBLADDER SURGERY    . MOUTH SURGERY     dental  . WISDOM TOOTH EXTRACTION           Family History  Problem Relation Age of Onset  . Glaucoma Paternal Grandfather   . Heart attack Paternal Grandfather   . Glaucoma Paternal Grandmother   . Heart attack Maternal Grandmother   . Heart disease Maternal Grandmother   . Diabetes Maternal Grandmother   . Heart attack Maternal Grandfather   . Heart disease Maternal Grandfather   . Carpal tunnel syndrome Mother   . Fibromyalgia Mother   . Heart disease Mother   . ADD / ADHD Daughter   . Cancer Maternal Aunt   . Heart attack Maternal Uncle     Social History        Tobacco Use  . Smoking status: Former Smoker    Types: E-cigarettes    Quit date: 03/21/2016    Years since quitting: 3.3  . Smokeless tobacco: Never Used  Substance Use Topics  . Alcohol use: Not Currently  . Drug use: Never    Allergies:        Allergies  Allergen Reactions  . Proanthocyanidin Anaphylaxis    Including grape juice, extracts, etc., reaction, throat gets scratchy, Pt gets short of breath, breaks out in rash also.           Medications Prior to Admission  Medication Sig Dispense Refill Last Dose  . acetaminophen (TYLENOL) 500 MG tablet Take 1,000 mg by mouth every 6 (six) hours as needed.   07/13/2019 at Unknown time  . aspirin EC 81 MG tablet Take 162 mg by mouth daily.   07/13/2019 at Unknown time  . blood glucose meter kit and supplies Dispense based on patient and insurance preference. Check blood sugar four times daily as directed. (FOR ICD-10 E10.9, E11.9). 1 each 0 07/13/2019 at Unknown time  . Calcium Carbonate Antacid (TUMS PO) Take by mouth as needed.   Past Week at Unknown time  . Famotidine (PEPCID AC PO) Take by mouth 2 (two) times a day.   Past Week at Unknown time  . glyBURIDE (DIABETA) 2.5 MG tablet Take 1 tablet (2.5 mg total) by mouth 2 (two) times daily with a meal. 60 tablet 3 07/13/2019 at Unknown time  . metFORMIN (GLUCOPHAGE) 500 MG tablet Take 1 tablet (500 mg total) by mouth daily with breakfast. 30 tablet 1 07/13/2019 at Unknown time  . nebivolol (BYSTOLIC) 5 MG tablet Take 1 tablet (5 mg total) by mouth daily. 30 tablet 0 07/13/2019 at Unknown time  . prenatal vitamin w/FE, FA (PRENATAL 1 + 1) 27-1 MG TABS tablet Take 1 tablet by mouth daily at 12 noon. 30 each 11 07/13/2019 at Unknown time    Review of Systems  Constitutional: Negative for fever.  Eyes: Negative for visual disturbance.  Respiratory: Negative for cough and shortness of breath.   Gastrointestinal: Positive for abdominal pain.  Genitourinary: Negative for dysuria, vaginal bleeding and vaginal discharge.  Neurological: Positive for dizziness and headaches.   Physical Exam   Blood pressure 120/87, pulse 91, temperature 97.6 F (36.4 C), resp. rate 16, weight 96.2 kg, last menstrual period 10/13/2018, SpO2 99 %. BP  on arrival to MAU was 156/97  Physical Exam  Nursing note and vitals reviewed. Constitutional: She is oriented to person, place, and time. She appears well-developed and well-nourished.  HENT:  Head: Normocephalic.  Eyes: EOM are  normal.  Neck: Neck supple.  GI: Soft. There is no abdominal tenderness. There is no rebound and no guarding.  No RUQ pain FHT baseline of 140 with moderate variability and 15x15 accels noted.  No decelerations and occasional contractions.  Reactive NST.  Musculoskeletal: Normal range of motion.        General: No edema.     Comments: No ankle edema  Neurological: She is alert and oriented to person, place, and time.  Skin: Skin is warm and dry.  Psychiatric: She has a normal mood and affect.   Care transferred to [facility]. Norman Herrlich, CNM at 6:25 pm  Lab Results Last 24 Hours       Results for orders placed or performed during the hospital encounter of 07/13/19 (from the past 24 hour(s))  Urinalysis, Routine w reflex microscopic     Status: Abnormal   Collection Time: 07/13/19  5:30 PM  Result Value Ref Range   Color, Urine AMBER (A) YELLOW   APPearance CLOUDY (A) CLEAR   Specific Gravity, Urine 1.016 1.005 - 1.030   pH 7.0 5.0 - 8.0   Glucose, UA NEGATIVE NEGATIVE mg/dL   Hgb urine dipstick SMALL (A) NEGATIVE   Bilirubin Urine NEGATIVE NEGATIVE   Ketones, ur NEGATIVE NEGATIVE mg/dL   Protein, ur 100 (A) NEGATIVE mg/dL   Nitrite NEGATIVE NEGATIVE   Leukocytes,Ua LARGE (A) NEGATIVE   RBC / HPF 11-20 0 - 5 RBC/hpf   WBC, UA >50 (H) 0 - 5 WBC/hpf   Bacteria, UA MANY (A) NONE SEEN   Squamous Epithelial / LPF >50 (H) 0 - 5   Mucus PRESENT    Budding Yeast PRESENT    Non Squamous Epithelial 0-5 (A) NONE SEEN  Protein / creatinine ratio, urine     Status: Abnormal   Collection Time: 07/13/19  5:32 PM  Result Value Ref Range   Creatinine, Urine 129.77 mg/dL   Total Protein, Urine 65 mg/dL   Protein Creatinine Ratio 0.50 (H) 0.00 -  0.15 mg/mg[Cre]  Protein / creatinine ratio, urine     Status: Abnormal   Collection Time: 07/13/19  5:33 PM  Result Value Ref Range   Creatinine, Urine 128.15 mg/dL   Total Protein, Urine 64 mg/dL   Protein Creatinine Ratio 0.50 (H) 0.00 - 0.15 mg/mg[Cre]  CBC     Status: None   Collection Time: 07/13/19  5:52 PM  Result Value Ref Range   WBC 10.0 4.0 - 10.5 K/uL   RBC 4.26 3.87 - 5.11 MIL/uL   Hemoglobin 12.2 12.0 - 15.0 g/dL   HCT 36.5 36.0 - 46.0 %   MCV 85.7 80.0 - 100.0 fL   MCH 28.6 26.0 - 34.0 pg   MCHC 33.4 30.0 - 36.0 g/dL   RDW 14.1 11.5 - 15.5 %   Platelets 186 150 - 400 K/uL   nRBC 0.2 0.0 - 0.2 %  Comprehensive metabolic panel     Status: Abnormal   Collection Time: 07/13/19  5:52 PM  Result Value Ref Range   Sodium 135 135 - 145 mmol/L   Potassium 4.2 3.5 - 5.1 mmol/L   Chloride 109 98 - 111 mmol/L   CO2 16 (L) 22 - 32 mmol/L   Glucose, Bld 61 (L) 70 - 99 mg/dL   BUN 10 6 - 20 mg/dL   Creatinine, Ser 0.72 0.44 - 1.00 mg/dL   Calcium 9.5 8.9 - 10.3 mg/dL   Total Protein 5.8 (L) 6.5 - 8.1 g/dL   Albumin  2.6 (L) 3.5 - 5.0 g/dL   AST 22 15 - 41 U/L   ALT 24 0 - 44 U/L   Alkaline Phosphatase 166 (H) 38 - 126 U/L   Total Bilirubin 0.3 0.3 - 1.2 mg/dL   GFR calc non Af Amer >60 >60 mL/min   GFR calc Af Amer >60 >60 mL/min   Anion gap 10 5 - 15       MAU Course  Procedures  MDM 7:15 PM Consult with Dr. Ilda Basset, will obs and have patient do 24 hour urine here on OBSC. Induction plan pending labs and patient status.   Assessment and Plan   1. Hypertension in pregnancy, transient, antepartum, third trimester   2. [redacted] weeks gestation of pregnancy   3. Proteinuria affecting pregnancy in third trimester    Admit to antenatal for 24 hour urine collection  Nambe, CNM  07/13/19  7:21 PM    Terri L Burleson 07/13/2019, 5:42 PM     Cosigned by: Aletha Halim, MD at 07/13/2019 8:59 PM

## 2019-07-14 LAB — CBC
HCT: 34.3 % — ABNORMAL LOW (ref 36.0–46.0)
Hemoglobin: 11.5 g/dL — ABNORMAL LOW (ref 12.0–15.0)
MCH: 29.1 pg (ref 26.0–34.0)
MCHC: 33.5 g/dL (ref 30.0–36.0)
MCV: 86.8 fL (ref 80.0–100.0)
Platelets: 180 10*3/uL (ref 150–400)
RBC: 3.95 MIL/uL (ref 3.87–5.11)
RDW: 14.2 % (ref 11.5–15.5)
WBC: 9.4 10*3/uL (ref 4.0–10.5)
nRBC: 0 % (ref 0.0–0.2)

## 2019-07-14 LAB — CREATININE, URINE, 24 HOUR
Collection Interval-UCRE24: 24 hours
Creatinine, 24H Ur: 1492 mg/d (ref 600–1800)
Creatinine, Urine: 106.59 mg/dL
Urine Total Volume-UCRE24: 1400 mL

## 2019-07-14 LAB — COMPREHENSIVE METABOLIC PANEL
ALT: 25 U/L (ref 0–44)
AST: 19 U/L (ref 15–41)
Albumin: 2.5 g/dL — ABNORMAL LOW (ref 3.5–5.0)
Alkaline Phosphatase: 155 U/L — ABNORMAL HIGH (ref 38–126)
Anion gap: 10 (ref 5–15)
BUN: 12 mg/dL (ref 6–20)
CO2: 18 mmol/L — ABNORMAL LOW (ref 22–32)
Calcium: 8.7 mg/dL — ABNORMAL LOW (ref 8.9–10.3)
Chloride: 108 mmol/L (ref 98–111)
Creatinine, Ser: 0.7 mg/dL (ref 0.44–1.00)
GFR calc Af Amer: >60 mL/min (ref >60)
GFR calc non Af Amer: >60 mL/min (ref >60)
Glucose, Bld: 80 mg/dL (ref 70–99)
Potassium: 3.9 mmol/L (ref 3.5–5.1)
Sodium: 136 mmol/L (ref 135–145)
Total Bilirubin: 0.5 mg/dL (ref 0.3–1.2)
Total Protein: 5.4 g/dL — ABNORMAL LOW (ref 6.5–8.1)

## 2019-07-14 LAB — GLUCOSE, CAPILLARY
Glucose-Capillary: 77 mg/dL (ref 70–99)
Glucose-Capillary: 96 mg/dL (ref 70–99)
Glucose-Capillary: 90 mg/dL (ref 70–99)
Glucose-Capillary: 98 mg/dL (ref 70–99)

## 2019-07-14 LAB — PROTEIN, URINE, 24 HOUR
Collection Interval-UPROT: 24 hours
Protein, 24H Urine: 406 mg/d — ABNORMAL HIGH (ref 50–100)
Protein, Urine: 29 mg/dL
Urine Total Volume-UPROT: 1400 mL

## 2019-07-14 NOTE — Plan of Care (Signed)
  Problem: Clinical Measurements: Goal: Complications related to the disease process, condition or treatment will be avoided or minimized Outcome: Progressing  Will monitor CBGs and Bps, pt. Aware to call for needs. 24 hr. Urine to be done at 1940 this PM. Pt. To call immediately for any changes in condition, aware of signs of pre-e

## 2019-07-14 NOTE — Progress Notes (Signed)
Patient ID: Mary Hardy, female   DOB: February 11, 1981, 38 y.o.   MRN: [id number] [place] COMPREHENSIVE PROGRESS NOTE  Mary Hardy is a 38 y.o. [id number] at [redacted]w[redacted]d  who is admitted for [facility] with possible PEC and A2DM.   Fetal presentation is cephalic. U/S 07/12/19 Length of Stay:  1  Days  Subjective: Pt without complaints this morning. Denies HA or visual changes. Tolerating diet. + FM   Vitals:  Blood pressure 132/84, pulse 78, temperature 98 F (36.7 C), resp. rate 18, height 5\' 2"  (1.575 m), weight 95.7 kg, last menstrual period 10/13/2018, SpO2 97 %. Physical Examination: Lungs clear Heart RRR Abd soft + BS gravid non tender  Fetal Monitoring:  NST pending  Labs:  Results for orders placed or performed during the hospital encounter of 07/13/19 (from the past 24 hour(s))  Urinalysis, Routine w reflex microscopic   Collection Time: 07/13/19  5:30 PM  Result Value Ref Range   Color, Urine AMBER (A) YELLOW   APPearance CLOUDY (A) CLEAR   Specific Gravity, Urine 1.016 1.005 - 1.030   pH 7.0 5.0 - 8.0   Glucose, UA NEGATIVE NEGATIVE mg/dL   Hgb urine dipstick SMALL (A) NEGATIVE   Bilirubin Urine NEGATIVE NEGATIVE   Ketones, ur NEGATIVE NEGATIVE mg/dL   Protein, ur 100 (A) NEGATIVE mg/dL   Nitrite NEGATIVE NEGATIVE   Leukocytes,Ua LARGE (A) NEGATIVE   RBC / HPF 11-20 0 - 5 RBC/hpf   WBC, UA >50 (H) 0 - 5 WBC/hpf   Bacteria, UA MANY (A) NONE SEEN   Squamous Epithelial / LPF >50 (H) 0 - 5   Mucus PRESENT    Budding Yeast PRESENT    Non Squamous Epithelial 0-5 (A) NONE SEEN  Protein / creatinine ratio, urine   Collection Time: 07/13/19  5:32 PM  Result Value Ref Range   Creatinine, Urine 129.77 mg/dL   Total Protein, Urine 65 mg/dL   Protein Creatinine Ratio 0.50 (H) 0.00 - 0.15 mg/mg[Cre]  Protein / creatinine ratio, urine   Collection Time: 07/13/19  5:33 PM  Result Value Ref Range   Creatinine, Urine 128.15 mg/dL   Total Protein, Urine 64 mg/dL    Protein Creatinine Ratio 0.50 (H) 0.00 - 0.15 mg/mg[Cre]  CBC   Collection Time: 07/13/19  5:52 PM  Result Value Ref Range   WBC 10.0 4.0 - 10.5 K/uL   RBC 4.26 3.87 - 5.11 MIL/uL   Hemoglobin 12.2 12.0 - 15.0 g/dL   HCT 36.5 36.0 - 46.0 %   MCV 85.7 80.0 - 100.0 fL   MCH 28.6 26.0 - 34.0 pg   MCHC 33.4 30.0 - 36.0 g/dL   RDW 14.1 11.5 - 15.5 %   Platelets 186 150 - 400 K/uL   nRBC 0.2 0.0 - 0.2 %  Comprehensive metabolic panel   Collection Time: 07/13/19  5:52 PM  Result Value Ref Range   Sodium 135 135 - 145 mmol/L   Potassium 4.2 3.5 - 5.1 mmol/L   Chloride 109 98 - 111 mmol/L   CO2 16 (L) 22 - 32 mmol/L   Glucose, Bld 61 (L) 70 - 99 mg/dL   BUN 10 6 - 20 mg/dL   Creatinine, Ser 0.72 0.44 - 1.00 mg/dL   Calcium 9.5 8.9 - 10.3 mg/dL   Total Protein 5.8 (L) 6.5 - 8.1 g/dL   Albumin 2.6 (L) 3.5 - 5.0 g/dL   AST 22 15 - 41 U/L   ALT 24 0 - 44 U/L  Alkaline Phosphatase 166 (H) 38 - 126 U/L   Total Bilirubin 0.3 0.3 - 1.2 mg/dL   GFR calc non Af Amer >60 >60 mL/min   GFR calc Af Amer >60 >60 mL/min   Anion gap 10 5 - 15  SARS Coronavirus 2 [facility](Hospital order, Performed in [facility] hospital lab) Nasopharyngeal Nasopharyngeal Swab   Collection Time: 07/13/19  7:33 PM   Specimen: Nasopharyngeal Swab  Result Value Ref Range   SARS Coronavirus 2 NEGATIVE NEGATIVE  Type and screen [facility]   Collection Time: 07/13/19  8:40 PM  Result Value Ref Range   ABO/RH(D) A POS    Antibody Screen NEG    Sample Expiration      07/16/2019,2359 Performed at [facility], [address] [place], [place] [address]   ABO/Rh   Collection Time: 07/13/19  8:40 PM  Result Value Ref Range   ABO/RH(D)      A POS Performed at [facility], [address] [place], [place] [address]   Glucose, capillary   Collection Time: 07/13/19 10:42 PM  Result Value Ref Range   Glucose-Capillary 99 70 - 99 mg/dL  Glucose, capillary   Collection Time: 07/14/19  6:29 AM   Result Value Ref Range   Glucose-Capillary 77 70 - 99 mg/dL  CBC   Collection Time: 07/14/19  7:19 AM  Result Value Ref Range   WBC 9.4 4.0 - 10.5 K/uL   RBC 3.95 3.87 - 5.11 MIL/uL   Hemoglobin 11.5 (L) 12.0 - 15.0 g/dL   HCT 09.834.3 (L) 11.936.0 - 14.746.0 %   MCV 86.8 80.0 - 100.0 fL   MCH 29.1 26.0 - 34.0 pg   MCHC 33.5 30.0 - 36.0 g/dL   RDW 82.914.2 56.211.5 - 13.015.5 %   Platelets 180 150 - 400 K/uL   nRBC 0.0 0.0 - 0.2 %  Comprehensive metabolic panel   Collection Time: 07/14/19  7:19 AM  Result Value Ref Range   Sodium 136 135 - 145 mmol/L   Potassium 3.9 3.5 - 5.1 mmol/L   Chloride 108 98 - 111 mmol/L   CO2 18 (L) 22 - 32 mmol/L   Glucose, Bld 80 70 - 99 mg/dL   BUN 12 6 - 20 mg/dL   Creatinine, Ser 8.650.70 0.44 - 1.00 mg/dL   Calcium 8.7 (L) 8.9 - 10.3 mg/dL   Total Protein 5.4 (L) 6.5 - 8.1 g/dL   Albumin 2.5 (L) 3.5 - 5.0 g/dL   AST 19 15 - 41 U/L   ALT 25 0 - 44 U/L   Alkaline Phosphatase 155 (H) 38 - 126 U/L   Total Bilirubin 0.5 0.3 - 1.2 mg/dL   GFR calc non Af Amer >60 >60 mL/min   GFR calc Af Amer >60 >60 mL/min   Anion gap 10 5 - 15  Glucose, capillary   Collection Time: 07/14/19  9:50 AM  Result Value Ref Range   Glucose-Capillary 96 70 - 99 mg/dL    Imaging Studies:    NA   Medications:  Scheduled . glyBURIDE  2.5 mg Oral Q breakfast  . metFORMIN  500 mg Oral Q breakfast  . nebivolol  5 mg Oral Daily  . prenatal multivitamin  1 tablet Oral Q1200   I have reviewed the patient's current medications.  ASSESSMENT: IUP  37 2/7 weeks CHTN with ? PEC A2 DM  PLAN: PEC labs remain normal. 24 urine in collection. Await 24 hr results if  suggestive of PEC, will proceed toward delivery. Continue routine antenatal care.   Hermina StaggersMichael L Chasty Randal 07/14/2019,10:14 AM

## 2019-07-15 ENCOUNTER — Other Ambulatory Visit: Payer: No Typology Code available for payment source

## 2019-07-15 LAB — CBC
HCT: 35.7 % — ABNORMAL LOW (ref 36.0–46.0)
Hemoglobin: 11.7 g/dL — ABNORMAL LOW (ref 12.0–15.0)
MCH: 29.3 pg (ref 26.0–34.0)
MCHC: 32.8 g/dL (ref 30.0–36.0)
MCV: 89.3 fL (ref 80.0–100.0)
Platelets: 200 10*3/uL (ref 150–400)
RBC: 4 MIL/uL (ref 3.87–5.11)
RDW: 14.4 % (ref 11.5–15.5)
WBC: 10.6 10*3/uL — ABNORMAL HIGH (ref 4.0–10.5)
nRBC: 0 % (ref 0.0–0.2)

## 2019-07-15 LAB — GC/CHLAMYDIA PROBE AMP
Chlamydia trachomatis, NAA: NEGATIVE
Neisseria Gonorrhoeae by PCR: NEGATIVE

## 2019-07-15 LAB — GLUCOSE, CAPILLARY
Glucose-Capillary: 100 mg/dL — ABNORMAL HIGH (ref 70–99)
Glucose-Capillary: 106 mg/dL — ABNORMAL HIGH (ref 70–99)
Glucose-Capillary: 95 mg/dL (ref 70–99)
Glucose-Capillary: 78 mg/dL (ref 70–99)
Glucose-Capillary: 132 mg/dL — ABNORMAL HIGH (ref 70–99)

## 2019-07-15 MED ORDER — SODIUM CHLORIDE 0.9 % IV SOLN
5.0000 10*6.[IU] | Freq: Once | INTRAVENOUS | Status: AC
  Administered 2019-07-15: 5 10*6.[IU] via INTRAVENOUS
  Filled 2019-07-15: qty 5

## 2019-07-15 MED ORDER — SOD CITRATE-CITRIC ACID 500-334 MG/5ML PO SOLN: 30.0000 mL | ORAL | Status: DC | PRN

## 2019-07-15 MED ORDER — LACTATED RINGERS IV SOLN
INTRAVENOUS | Status: DC
  Administered 2019-07-15: 16:00:00 via INTRAVENOUS

## 2019-07-15 MED ORDER — MISOPROSTOL 25 MCG QUARTER TABLET: 25.0000 ug | ORAL_TABLET | Freq: Once | ORAL | Status: DC

## 2019-07-15 MED ORDER — OXYTOCIN 40 UNITS IN NORMAL SALINE INFUSION - SIMPLE MED
2.5000 [IU]/h | INTRAVENOUS | Status: DC
  Filled 2019-07-15: qty 1000

## 2019-07-15 MED ORDER — SODIUM CHLORIDE 0.9 % IV SOLN: 250.0000 mL | INTRAVENOUS | Status: DC | PRN

## 2019-07-15 MED ORDER — ONDANSETRON HCL 4 MG/2ML IJ SOLN: 4.0000 mg | Freq: Four times a day (QID) | INTRAMUSCULAR | Status: DC | PRN

## 2019-07-15 MED ORDER — PENICILLIN G 3 MILLION UNITS IVPB - SIMPLE MED
3.0000 10*6.[IU] | INTRAVENOUS | Status: DC
  Administered 2019-07-15 – 2019-07-16 (×4): 3 10*6.[IU] via INTRAVENOUS
  Filled 2019-07-15 (×4): qty 100

## 2019-07-15 MED ORDER — MISOPROSTOL 25 MCG QUARTER TABLET
ORAL_TABLET | ORAL | Status: AC
  Administered 2019-07-15: 16:00:00 25 ug
  Filled 2019-07-15: qty 1

## 2019-07-15 MED ORDER — SODIUM CHLORIDE 0.9% FLUSH: 3.0000 mL | Freq: Two times a day (BID) | INTRAVENOUS | Status: DC

## 2019-07-15 MED ORDER — MISOPROSTOL 25 MCG QUARTER TABLET
25.0000 ug | ORAL_TABLET | Freq: Once | ORAL | Status: AC
  Administered 2019-07-15: 25 ug via VAGINAL
  Filled 2019-07-15: qty 1

## 2019-07-15 MED ORDER — LIDOCAINE HCL (PF) 1 % IJ SOLN: 30.0000 mL | INTRAMUSCULAR | Status: DC | PRN

## 2019-07-15 MED ORDER — SODIUM CHLORIDE 0.9% FLUSH: 3.0000 mL | INTRAVENOUS | Status: DC | PRN

## 2019-07-15 MED ORDER — OXYTOCIN BOLUS FROM INFUSION: 500.0000 mL | Freq: Once | INTRAVENOUS | Status: DC

## 2019-07-15 MED ORDER — LACTATED RINGERS IV SOLN
500.0000 mL | INTRAVENOUS | Status: DC | PRN
  Administered 2019-07-16: 500 mL via INTRAVENOUS

## 2019-07-15 NOTE — Progress Notes (Signed)
Labor Progress Note Kylynn Street is a 38 y.o. [phone] at [redacted]w[redacted]d presented for IOL for preeclampsia, GDMA2.  S: Currently comfortable. Mild contractions. No new complaints  O:  BP (!) 134/93   Pulse 81   Temp 98.8 F (37.1 C) (Oral)   Resp 18   Ht 5\' 2"  (1.575 m)   Wt 95.7 kg   LMP 10/13/2018 (Approximate)   SpO2 94%   BMI 38.59 kg/m  EFM: 150/moderate var/accels, no decels  CVE: Dilation: 1.5 Effacement (%): Thick Station: -3 Presentation: Vertex Exam by:: J.Cox, RN   A&P: 38 y.o. [id number] [redacted]w[redacted]d here for IOL for pre-e and GDMA2.  #Labor: Progressing well on cytotec x2.  Plan to reassess for foley balloon at next cervical check. #Pain: Epidural upon request #FWB: category I #GBS positive on Penicillin #GDMA2: last CBG 132 shortly after eating. No other CBGs over 127.  Will continue to monitor and provide her am metformin and glyburide. #Pre-e on cHTN: last two Bps 130s/90s. Continue to monitor. No red flags. Continue daily nebivolol.   Matilde Haymaker, MD 10:14 PM

## 2019-07-15 NOTE — Progress Notes (Addendum)
Labor Progress Note Mary Hardy is a 38 y.o. female [phone] at 1w3dwas seen in MAU on 07-08-19 (140/93) and discharged with chronic hypertension and diabetes taking metformin, diabeta and bystolic, but labs did not indicate preeclampsia. She returned on 8/19 and had one elevated BP and was admitted for monitoring. Labs eventually showed mild Pre-E and patient transferred to L&D for IOL. Patient with a hx of cHTN and A2DM.  Patient reports feeling well. Denies headache, visual changes, RUQ abdominal pain, SOB. Feeling some vaginal pressure but otherwise denies vaginal bleeding, LOF, Ctx or decreased FM.  Dating: By 7 wk [place]--->  Estimated Date of Delivery: 08/02/19  Sono: 07/12/2019  [place]369wks,cephalic,anterior placenta gr 3,normal ovaries bilat,BPP 8/8,AFI 9.4 cm,fhr 127 bpm,RI .62,.72,.69=89%. EFW at 82%  Prenatal History/Complications:  Past Medical History: Past Medical History:  Diagnosis Date  . Anemia   . Anxiety   . Chronic hypertension   . Depression   . Eclampsia (toxemia of pregnancy)   . History of eating disorder   . Incontinence of urine    due to nerve and muscle damage during child birth    Past Surgical History: Past Surgical History:  Procedure Laterality Date  . GALLBLADDER SURGERY    . MOUTH SURGERY     dental  . WISDOM TOOTH EXTRACTION      Obstetrical History: OB History    Gravida  4   Para  1   Term      Preterm  1   AB  2   Living  1     SAB  2   TAB      Ectopic      Multiple      Live Births  1           Social History: Social History   Socioeconomic History  . Marital status: Married    Spouse name: DQuillian Quince . Number of children: 1  . Years of education: Not on file  . Highest education level: Not on file  Occupational History  . Not on file  Social Needs  . Financial resource strain: Not hard at all  . Food insecurity    Worry: Not on file    Inability: Not on file  . Transportation needs    Medical: Not on  file    Non-medical: Not on file  Tobacco Use  . Smoking status: Former Smoker    Types: E-cigarettes    Quit date: 03/21/2016    Years since quitting: 3.3  . Smokeless tobacco: Never Used  Substance and Sexual Activity  . Alcohol use: Not Currently  . Drug use: Never  . Sexual activity: Yes    Birth control/protection: None  Lifestyle  . Physical activity    Days per week: Not on file    Minutes per session: Not on file  . Stress: Not on file  Relationships  . Social [place] phone: Not on file    Gets together: Not on file    Attends religious service: Not on file    Active member of club or organization: Not on file    Attends meetings of clubs or organizations: Not on file    Relationship status: Not on file  Other Topics Concern  . Not on file  Social History Narrative  . Not on file    Family History: Family History  Problem Relation Age of Onset  . Glaucoma Paternal Grandfather   .  Heart attack Paternal Grandfather   . Glaucoma Paternal Grandmother   . Heart attack Maternal Grandmother   . Heart disease Maternal Grandmother   . Diabetes Maternal Grandmother   . Heart attack Maternal Grandfather   . Heart disease Maternal Grandfather   . Carpal tunnel syndrome Mother   . Fibromyalgia Mother   . Heart disease Mother   . ADD / ADHD Daughter   . Cancer Maternal Aunt   . Heart attack Maternal Uncle     Allergies: Allergies  Allergen Reactions  . Proanthocyanidin Anaphylaxis    Including grape juice, extracts, etc., reaction, throat gets scratchy, Pt gets short of breath, breaks out in rash also.    Medications Prior to Admission  Medication Sig Dispense Refill Last Dose  . acetaminophen (TYLENOL) 500 MG tablet Take 1,000 mg by mouth every 6 (six) hours as needed.   07/13/2019 at Unknown time  . aspirin EC 81 MG tablet Take 162 mg by mouth daily.   07/13/2019 at Unknown time  . blood glucose meter kit and supplies Dispense based on patient  and insurance preference. Check blood sugar four times daily as directed. (FOR ICD-10 E10.9, E11.9). 1 each 0 07/13/2019 at Unknown time  . Calcium Carbonate Antacid (TUMS PO) Take by mouth as needed.   Past Week at Unknown time  . Famotidine (PEPCID AC PO) Take by mouth 2 (two) times a day.   Past Week at Unknown time  . glyBURIDE (DIABETA) 2.5 MG tablet Take 1 tablet (2.5 mg total) by mouth 2 (two) times daily with a meal. 60 tablet 3 07/13/2019 at Unknown time  . metFORMIN (GLUCOPHAGE) 500 MG tablet Take 1 tablet (500 mg total) by mouth daily with breakfast. 30 tablet 1 07/13/2019 at Unknown time  . nebivolol (BYSTOLIC) 5 MG tablet Take 1 tablet (5 mg total) by mouth daily. 30 tablet 0 07/13/2019 at Unknown time  . prenatal vitamin w/FE, FA (PRENATAL 1 + 1) 27-1 MG TABS tablet Take 1 tablet by mouth daily at 12 noon. 30 each 11 07/13/2019 at Unknown time     Review of Systems   All systems reviewed and negative except as stated in HPI  Blood pressure (!) 142/90, pulse 83, temperature 98.8 F (37.1 C), temperature source Oral, resp. rate 18, height 5' 2"  (1.575 m), weight 95.7 kg, last menstrual period 10/13/2018, SpO2 94 %. General appearance: alert, cooperative, appears stated age and moderately obese Lungs: normal effort Heart: regular rate  Abdomen: soft, non-tender; bowel sounds normal Pelvic: gravid uterus  Extremities: Homans sign is negative, no sign of DVT Presentation: cephalic by exam  Fetal monitoring: 135, moderate variability, reactive, pos accels and no decels.  Uterine activity: None     Prenatal labs: ABO, Rh: --/--/A POS, A POS Performed at [facility], [place] [address], [place], [place] [address]  [phone]) Antibody: NEG (08/19 2040) Rubella: 11.40 (02/10 1507) RPR: Non Reactive (06/23 0827)  HBsAg: Negative (02/10 1507)  HIV: Non Reactive (06/23 0827)  GBS:   GBS uria 2 hr Glucola positive on 07/19/2019 Genetic screening  Integrated 2 neg Anatomy US  normal   Prenatal Transfer Tool  Maternal Diabetes: Yes:  Diabetes Type:  Insulin/Medication controlled Genetic Screening: Normal Maternal Ultrasounds/Referrals: Normal Fetal Ultrasounds or other Referrals:  None Maternal Substance Abuse:  No Significant Maternal Medications:  Glyburide, Metformin, Aspirin, Bystolic  Significant Maternal Lab Results: Group B Strep positive  Results for orders placed or performed during the hospital encounter of  07/13/19 (from the past 24 hour(s))  Glucose, capillary   Collection Time: 07/14/19  9:01 PM  Result Value Ref Range   Glucose-Capillary 98 70 - 99 mg/dL  Glucose, capillary   Collection Time: 07/15/19  5:29 AM  Result Value Ref Range   Glucose-Capillary 100 (H) 70 - 99 mg/dL  Glucose, capillary   Collection Time: 07/15/19 11:34 AM  Result Value Ref Range   Glucose-Capillary 106 (H) 70 - 99 mg/dL  Glucose, capillary   Collection Time: 07/15/19  2:03 PM  Result Value Ref Range   Glucose-Capillary 95 70 - 99 mg/dL  Glucose, capillary   Collection Time: 07/15/19  4:28 PM  Result Value Ref Range   Glucose-Capillary 78 70 - 99 mg/dL    Patient Active Problem List   Diagnosis Date Noted  . Proteinuria in pregnancy, antepartum 07/13/2019  . Transient hypertension of pregnancy 07/13/2019  . GBS bacteriuria 06/01/2019  . Diabetes mellitus ([place]) 05/18/2019  . Chronic hypertension affecting pregnancy 01/25/2019  . Depression with anxiety 01/04/2019  . Marijuana use 01/04/2019  . Supervision of high risk pregnancy, antepartum 01/03/2019  . H/O eclampsia, then readmit for pp pre-e 01/03/2019  . SUI (stress urinary incontinence, female) 01/03/2019  . History of preterm delivery 12/09/2018    Assessment/Plan:  Mary Hardy is a 38 y.o. [phone] at 17w3dhere for for IOL secondary to Pre-E without severe features.  #Chronic HTN with superimposed Pre-E: serial BP's; monitor for severe features; no Mg ppx indicated as not severe. Cont PTA  Bystolic  ##C0IC CBG qH7Vin latent labor and q2h in active labor; cont PTA Metformin and Glyburide. Carb modified diet while cervical ripening.  #Labor: SVE 2/thick/-3; Cytotec placed; vertex by exam and BSUS #Pain: Epidural when desires #FWB: Cat I; EFW: 3500g #ID:  GBS pos - PCN initiated; COVID neg #MOF: Breast #MOC: PP Nexplanon  CBarrington Ellison MD OB Family Medicine Fellow, [facility]8/21/2020, 4:42 PM

## 2019-07-15 NOTE — Progress Notes (Signed)
Verbal order received to place cytotec at 1625. Cytotec placed by Dr. Marice Potter. Order placed later for cytotec to be continued every 4 hours.

## 2019-07-15 NOTE — Progress Notes (Signed)
Patient ID: Mary Hardy, female   DOB: 27-Jun-1981, 38 y.o.   MRN: [id number] [place] COMPREHENSIVE PROGRESS NOTE  Mary Hardy is a 38 y.o. [id number] at [redacted]w[redacted]d  who is admitted for [facility] with possible PEC and A2DM.   Fetal presentation is cephalic. U/S 07/12/19 Length of Stay:  2  Days  Subjective: Pt without complaints this morning. Denies HA or visual changes. Patient reports good fetal movement.  She reports no uterine contractions, no bleeding and no loss of fluid per vagina.  Vitals:  Blood pressure 130/89, pulse 80, temperature 98 F (36.7 C), temperature source Oral, resp. rate 18, height 5\' 2"  (1.575 m), weight 95.7 kg, last menstrual period 10/13/2018, SpO2 95 %. Physical Examination: Lungs clear Heart RRR Abd soft + BS gravid non tender Ext non tender  Fetal Monitoring:  140-150's + accels, good variability  Labs:  Results for orders placed or performed during the hospital encounter of 07/13/19 (from the past 24 hour(s))  Glucose, capillary   Collection Time: 07/14/19  9:50 AM  Result Value Ref Range   Glucose-Capillary 96 70 - 99 mg/dL  Glucose, capillary   Collection Time: 07/14/19  3:24 PM  Result Value Ref Range   Glucose-Capillary 90 70 - 99 mg/dL  Glucose, capillary   Collection Time: 07/14/19  9:01 PM  Result Value Ref Range   Glucose-Capillary 98 70 - 99 mg/dL  Glucose, capillary   Collection Time: 07/15/19  5:29 AM  Result Value Ref Range   Glucose-Capillary 100 (H) 70 - 99 mg/dL    Imaging Studies:    NA   Medications:  Scheduled . glyBURIDE  2.5 mg Oral Q breakfast  . metFORMIN  500 mg Oral Q breakfast  . nebivolol  5 mg Oral Daily  . prenatal multivitamin  1 tablet Oral Q1200   I have reviewed the patient's current medications.  ASSESSMENT: IUP 37 3/7 weeks CHTN with PEC by proteinuria A2DM  PLAN: Will transfer to L & D for IOL d/t above. Reviewed with pt. Nursing and L & D aware    Chancy Milroy 07/15/2019,9:49  AM

## 2019-07-16 ENCOUNTER — Inpatient Hospital Stay (HOSPITAL_COMMUNITY): Payer: No Typology Code available for payment source | Admitting: Anesthesiology

## 2019-07-16 DIAGNOSIS — O1404 Mild to moderate pre-eclampsia, complicating childbirth: Secondary | ICD-10-CM

## 2019-07-16 DIAGNOSIS — O10919 Unspecified pre-existing hypertension complicating pregnancy, unspecified trimester: Secondary | ICD-10-CM

## 2019-07-16 DIAGNOSIS — Z3A37 37 weeks gestation of pregnancy: Secondary | ICD-10-CM

## 2019-07-16 DIAGNOSIS — O2412 Pre-existing diabetes mellitus, type 2, in childbirth: Secondary | ICD-10-CM

## 2019-07-16 DIAGNOSIS — O24419 Gestational diabetes mellitus in pregnancy, unspecified control: Secondary | ICD-10-CM

## 2019-07-16 DIAGNOSIS — E119 Type 2 diabetes mellitus without complications: Secondary | ICD-10-CM

## 2019-07-16 DIAGNOSIS — O1490 Unspecified pre-eclampsia, unspecified trimester: Secondary | ICD-10-CM

## 2019-07-16 LAB — CBC
HCT: 33.1 % — ABNORMAL LOW (ref 36.0–46.0)
HCT: 34.7 % — ABNORMAL LOW (ref 36.0–46.0)
HCT: 34.7 % — ABNORMAL LOW (ref 36.0–46.0)
Hemoglobin: 10.9 g/dL — ABNORMAL LOW (ref 12.0–15.0)
Hemoglobin: 11.5 g/dL — ABNORMAL LOW (ref 12.0–15.0)
Hemoglobin: 11.3 g/dL — ABNORMAL LOW (ref 12.0–15.0)
MCH: 29.2 pg (ref 26.0–34.0)
MCH: 28.8 pg (ref 26.0–34.0)
MCH: 28.7 pg (ref 26.0–34.0)
MCHC: 32.9 g/dL (ref 30.0–36.0)
MCHC: 33.1 g/dL (ref 30.0–36.0)
MCHC: 32.6 g/dL (ref 30.0–36.0)
MCV: 88.7 fL (ref 80.0–100.0)
MCV: 86.8 fL (ref 80.0–100.0)
MCV: 88.1 fL (ref 80.0–100.0)
Platelets: 190 10*3/uL (ref 150–400)
Platelets: 177 10*3/uL (ref 150–400)
Platelets: 165 10*3/uL (ref 150–400)
RBC: 3.73 MIL/uL — ABNORMAL LOW (ref 3.87–5.11)
RBC: 4 MIL/uL (ref 3.87–5.11)
RBC: 3.94 MIL/uL (ref 3.87–5.11)
RDW: 14.3 % (ref 11.5–15.5)
RDW: 14 % (ref 11.5–15.5)
RDW: 14.1 % (ref 11.5–15.5)
WBC: 11.7 10*3/uL — ABNORMAL HIGH (ref 4.0–10.5)
WBC: 14.9 10*3/uL — ABNORMAL HIGH (ref 4.0–10.5)
WBC: 18.6 10*3/uL — ABNORMAL HIGH (ref 4.0–10.5)
nRBC: 0 % (ref 0.0–0.2)
nRBC: 0 % (ref 0.0–0.2)
nRBC: 0 % (ref 0.0–0.2)

## 2019-07-16 LAB — GLUCOSE, CAPILLARY
Glucose-Capillary: 108 mg/dL — ABNORMAL HIGH (ref 70–99)
Glucose-Capillary: 148 mg/dL — ABNORMAL HIGH (ref 70–99)
Glucose-Capillary: 91 mg/dL (ref 70–99)
Glucose-Capillary: 97 mg/dL (ref 70–99)

## 2019-07-16 LAB — RPR: RPR Ser Ql: NONREACTIVE

## 2019-07-16 MED ORDER — LIDOCAINE-EPINEPHRINE (PF) 2 %-1:200000 IJ SOLN
INTRAMUSCULAR | Status: DC | PRN
  Administered 2019-07-16 (×2): 5 mg via INTRADERMAL

## 2019-07-16 MED ORDER — SENNOSIDES-DOCUSATE SODIUM 8.6-50 MG PO TABS
2.0000 | ORAL_TABLET | ORAL | Status: DC
  Administered 2019-07-17 (×2): 2 via ORAL
  Filled 2019-07-16 (×2): qty 2

## 2019-07-16 MED ORDER — DIPHENHYDRAMINE HCL 25 MG PO CAPS: 25.0000 mg | ORAL_CAPSULE | Freq: Four times a day (QID) | ORAL | Status: DC | PRN

## 2019-07-16 MED ORDER — ACETAMINOPHEN 325 MG PO TABS
650.0000 mg | ORAL_TABLET | Freq: Four times a day (QID) | ORAL | Status: DC | PRN
  Administered 2019-07-18: 08:00:00 650 mg via ORAL
  Filled 2019-07-16: qty 2

## 2019-07-16 MED ORDER — MEASLES, MUMPS & RUBELLA VAC IJ SOLR: 0.5000 mL | Freq: Once | INTRAMUSCULAR | Status: DC

## 2019-07-16 MED ORDER — ONDANSETRON HCL 4 MG/2ML IJ SOLN: 4.0000 mg | INTRAMUSCULAR | Status: DC | PRN

## 2019-07-16 MED ORDER — COCONUT OIL OIL: 1.0000 "application " | TOPICAL_OIL | Status: DC | PRN

## 2019-07-16 MED ORDER — DIPHENHYDRAMINE HCL 50 MG/ML IJ SOLN: 12.5000 mg | INTRAMUSCULAR | Status: DC | PRN

## 2019-07-16 MED ORDER — NIFEDIPINE ER OSMOTIC RELEASE 30 MG PO TB24
30.0000 mg | ORAL_TABLET | Freq: Every day | ORAL | Status: DC
  Administered 2019-07-16 – 2019-07-18 (×3): 30 mg via ORAL
  Filled 2019-07-16 (×3): qty 1

## 2019-07-16 MED ORDER — PROMETHAZINE HCL 25 MG/ML IJ SOLN
12.5000 mg | Freq: Once | INTRAMUSCULAR | Status: AC
  Administered 2019-07-16: 04:00:00 12.5 mg via INTRAVENOUS
  Filled 2019-07-16: qty 1

## 2019-07-16 MED ORDER — LACTATED RINGERS IV SOLN
500.0000 mL | Freq: Once | INTRAVENOUS | Status: AC
  Administered 2019-07-16: 500 mL via INTRAVENOUS

## 2019-07-16 MED ORDER — FENTANYL CITRATE (PF) 100 MCG/2ML IJ SOLN
100.0000 ug | INTRAMUSCULAR | Status: DC | PRN
  Administered 2019-07-16 (×6): 100 ug via INTRAVENOUS
  Filled 2019-07-16 (×7): qty 2

## 2019-07-16 MED ORDER — PHENYLEPHRINE 40 MCG/ML (10ML) SYRINGE FOR IV PUSH (FOR BLOOD PRESSURE SUPPORT)
80.0000 ug | PREFILLED_SYRINGE | INTRAVENOUS | Status: DC | PRN
  Administered 2019-07-16 (×2): 80 ug via INTRAVENOUS

## 2019-07-16 MED ORDER — SODIUM CHLORIDE (PF) 0.9 % IJ SOLN
INTRAMUSCULAR | Status: DC | PRN
  Administered 2019-07-16: 12 mL/h via EPIDURAL

## 2019-07-16 MED ORDER — IBUPROFEN 600 MG PO TABS
600.0000 mg | ORAL_TABLET | Freq: Three times a day (TID) | ORAL | Status: DC | PRN
  Administered 2019-07-16 – 2019-07-18 (×5): 600 mg via ORAL
  Filled 2019-07-16 (×5): qty 1

## 2019-07-16 MED ORDER — PRENATAL MULTIVITAMIN CH
1.0000 | ORAL_TABLET | Freq: Every day | ORAL | Status: DC
  Administered 2019-07-16 – 2019-07-18 (×3): 1 via ORAL
  Filled 2019-07-16 (×2): qty 1

## 2019-07-16 MED ORDER — TETANUS-DIPHTH-ACELL PERTUSSIS 5-2.5-18.5 LF-MCG/0.5 IM SUSP: 0.5000 mL | Freq: Once | INTRAMUSCULAR | Status: DC

## 2019-07-16 MED ORDER — DIBUCAINE (PERIANAL) 1 % EX OINT: 1.0000 "application " | TOPICAL_OINTMENT | CUTANEOUS | Status: DC | PRN

## 2019-07-16 MED ORDER — FENTANYL-BUPIVACAINE-NACL 0.5-0.125-0.9 MG/250ML-% EP SOLN
12.0000 mL/h | EPIDURAL | Status: DC | PRN
  Filled 2019-07-16: qty 250

## 2019-07-16 MED ORDER — EPHEDRINE 5 MG/ML INJ
10.0000 mg | INTRAVENOUS | Status: DC | PRN
  Filled 2019-07-16: qty 10

## 2019-07-16 MED ORDER — BENZOCAINE-MENTHOL 20-0.5 % EX AERO
1.0000 "application " | INHALATION_SPRAY | CUTANEOUS | Status: DC | PRN
  Administered 2019-07-17: 1 via TOPICAL
  Filled 2019-07-16: qty 56

## 2019-07-16 MED ORDER — EPHEDRINE 5 MG/ML INJ
10.0000 mg | INTRAVENOUS | Status: DC | PRN
  Administered 2019-07-16: 12:00:00 10 mg via INTRAVENOUS

## 2019-07-16 MED ORDER — LIDOCAINE HCL (PF) 1 % IJ SOLN
INTRAMUSCULAR | Status: DC | PRN
  Administered 2019-07-16: 6 mL via EPIDURAL
  Administered 2019-07-16: 7 mL via EPIDURAL

## 2019-07-16 MED ORDER — BUTORPHANOL TARTRATE 1 MG/ML IJ SOLN
2.0000 mg | Freq: Once | INTRAMUSCULAR | Status: AC
  Administered 2019-07-16: 04:00:00 2 mg via INTRAVENOUS
  Filled 2019-07-16: qty 2

## 2019-07-16 MED ORDER — SIMETHICONE 80 MG PO CHEW: 80.0000 mg | CHEWABLE_TABLET | ORAL | Status: DC | PRN

## 2019-07-16 MED ORDER — TERBUTALINE SULFATE 1 MG/ML IJ SOLN: 0.2500 mg | Freq: Once | INTRAMUSCULAR | Status: DC | PRN

## 2019-07-16 MED ORDER — PHENYLEPHRINE 40 MCG/ML (10ML) SYRINGE FOR IV PUSH (FOR BLOOD PRESSURE SUPPORT)
80.0000 ug | PREFILLED_SYRINGE | INTRAVENOUS | Status: DC | PRN
  Administered 2019-07-16: 12:00:00 80 ug via INTRAVENOUS
  Filled 2019-07-16: qty 10

## 2019-07-16 MED ORDER — MISOPROSTOL 50MCG HALF TABLET
50.0000 ug | ORAL_TABLET | ORAL | Status: DC
  Administered 2019-07-16 (×2): 50 ug via BUCCAL
  Filled 2019-07-16 (×2): qty 1

## 2019-07-16 MED ORDER — WITCH HAZEL-GLYCERIN EX PADS: 1.0000 "application " | MEDICATED_PAD | CUTANEOUS | Status: DC | PRN

## 2019-07-16 MED ORDER — ONDANSETRON HCL 4 MG PO TABS: 4.0000 mg | ORAL_TABLET | ORAL | Status: DC | PRN

## 2019-07-16 NOTE — Progress Notes (Signed)
Labor Progress Note Mary Hardy is a 38 y.o. [phone] at [redacted]w[redacted]d presented for IOL for preeclampsia, GDMA2.  S: Just got up to go to the bathroom. Some discomfort with contractions.   O:  BP 124/78   Pulse 77   Temp 98.8 F (37.1 C) (Oral)   Resp 16   Ht 5\' 2"  (1.575 m)   Wt 95.7 kg   LMP 10/13/2018 (Approximate)   SpO2 94%   BMI 38.59 kg/m  EFM: 135/moderate var/accels, no decels  CVE: 2/thick/-2   A&P: 38 y.o. [id number] [redacted]w[redacted]d here for IOL for pre-e and GDMA2.  #Labor: Progressing well on vaginal cytotec x2.  Foley balloon placed. Will continue with buccal cytotec. #Pain: Epidural upon request #FWB: category I #GBS positive on Penicillin #GDMA2: last CBG 97.  Will continue to monitor and provide her am metformin and glyburide. #Pre-e on cHTN: last two bps WNL. Continue to monitor. No red flags. Continue daily nebivolol.   Matilde Haymaker, MD 12:45 AM

## 2019-07-16 NOTE — Progress Notes (Signed)
Labor Progress Note Mary Hardy is a 38 y.o. [id number] at [redacted]w[redacted]d presented for IOL for preeclampsia, GDMA2.  S: Reporting a lot of pain with contractions and desires Epidural.  O:  BP 133/80 (BP Location: Right Arm)   Pulse 85   Temp 98.3 F (36.8 C) (Oral)   Resp 18   Ht 5\' 2"  (1.575 m)   Wt 95.7 kg   LMP 10/13/2018 (Approximate)   SpO2 94%   BMI 38.59 kg/m  EFM: 125/moderate var/accels; ctx q4-5 min  CVE:  Dilation: 5 Effacement (%): 30 Cervical Position: Middle Station: 0 Presentation: Vertex Exam by:: Dr Pilar Plate   A&P: 38 y.o. [id number] [redacted]w[redacted]d here for IOL for pre-e and GDMA2.  #Labor: Progressing on cytotec x3 (2 vaginal, 1 buccal). S/p foley baloon. Will wait for patient to get Epidural and then repeat SVE and possibly start Pit.  #Pain: Epidural after CBC returns #FWB: category I #GBS positive on Penicillin #GDMA2: last CBG 91.  Will continue to monitor and provide her am metformin and glyburide. Glucose 108 at 0846 #Pre-e on cHTN: Continue to monitor. Continue daily nebivolol.  BP Readings from Last 4 Encounters:  07/16/19 133/80  07/12/19 (!) 144/93  07/08/19 (!) 137/98  07/05/19 119/87   Chauncey Mann, MD 9:33 AM

## 2019-07-16 NOTE — Anesthesia Procedure Notes (Signed)
Epidural Patient location during procedure: OB Start time: 07/16/2019 10:17 AM End time: 07/16/2019 10:20 AM  Staffing Anesthesiologist: Lyn Hollingshead, MD Performed: anesthesiologist   Preanesthetic Checklist Completed: patient identified, site marked, surgical consent, pre-op evaluation, timeout performed, IV checked, risks and benefits discussed and monitors and equipment checked  Epidural Patient position: sitting Prep: site prepped and draped and DuraPrep Patient monitoring: continuous pulse ox and blood pressure Approach: midline Location: L3-L4 Injection technique: LOR air  Needle:  Needle type: Tuohy  Needle gauge: 17 G Needle length: 9 cm and 9 Needle insertion depth: 6 cm Catheter type: closed end flexible Catheter size: 19 Gauge Catheter at skin depth: 11 cm Test dose: negative and Other  Assessment Events: blood not aspirated, injection not painful, no injection resistance, negative IV test and no paresthesia  Additional Notes Reason for block:procedure for pain

## 2019-07-16 NOTE — Lactation Note (Signed)
This note was copied from a baby's chart. Lactation Consultation Note  Patient Name: Mary Hardy [id number] Date: 07/16/2019 Reason for consult: Initial assessment;1st time breastfeeding;Early term 37-38.6wks;Maternal endocrine disorder Type of Endocrine Disorder?: Diabetes P2, 10 hour female infant, ETI and mom with hx GBM. Infant had 3 voids and 2 stools since birth. Mom current feeding choice is breast and bottle feeding. Per mom, infant has been spitty and had  2 episodes of emesis.  Infant has been given formula 3 times (20 mls) per feeding. Infant was given formula in [facility] and due mom not feeling well.  Infant is in [facility] due low blood sugars. This is mom's first attempted to latch infant to right breast, infant reluctant to breastfeed at this time only held breast in mouth. Mom will continue to work towards latching infant to breast. LC discussed hand expression and mom taught back hand expression and  infant was given 10 ml of colostrum by spoon. Mom knows to breastfeed infant according hunger cues, 8 to 12 times within 24 hours and on demand. Mom was given DEBP and explained how to use it by Nurse. Mom shown how to use DEBP & how to disassemble, clean, & reassemble parts. Mom knows to use DEBP every 3 hours for 15 minutes on initial setting.  Reviewed Baby & Me book's Breastfeeding Basics.  Mom made aware of O/P services, breastfeeding support groups, community resources, and our phone # for post-discharge questions.  Mom's plan: 1. Mom will breastfeed infant according hunger cues,. 8 to 12 times within 24 hours on demand. 2. Mom will offer infant EBM from hand expression or using DEBP first and then supplement with formula according infant's age/ hours of life. 3. Mom will use DEBP every 3 hours for 15 minutes on initial setting and given infant back any EBM that is pumped.  4. Mom continue to work towards latching infant to breast and ask for assistance  from Nurse or [place] if needed.  Maternal Data Formula Feeding for Exclusion: No Has patient been taught Hand Expression?: Yes Does the patient have breastfeeding experience prior to this delivery?: No  Feeding Feeding Type: Breast Milk Nipple Type: Slow - flow  LATCH Score Latch: Too sleepy or reluctant, no latch achieved, no sucking elicited.  Audible Swallowing: None  Type of Nipple: Everted at rest and after stimulation  Comfort (Breast/Nipple): Soft / non-tender  Hold (Positioning): Assistance needed to correctly position infant at breast and maintain latch.  LATCH Score: 5  Interventions Interventions: Breast feeding basics reviewed;Assisted with latch;Skin to skin;Breast massage;Hand express;Breast compression;Adjust position;Support pillows;Position options;Expressed milk;DEBP  Lactation Tools Discussed/Used WIC Program: No Pump Review: Setup, frequency, and cleaning;Milk Storage Initiated by:: by Nurse  Date initiated:: 07/16/19   Consult Status Consult Status: Follow-up Date: 07/17/19 Follow-up type: In-patient    Vicente Serene 07/16/2019, 10:58 PM

## 2019-07-16 NOTE — Discharge Summary (Signed)
Postpartum Discharge Summary     Patient Name: Mary Hardy DOB: [birth year] MRN: [id number]  Date of admission: 07/13/2019 Delivering Provider: Chauncey Mann   Date of discharge: 07/18/2019  Admitting diagnosis: HBP Intrauterine pregnancy: [redacted]w[redacted]d    Secondary diagnosis:  Active Problems:   GBS bacteriuria   Proteinuria in pregnancy, antepartum   Transient hypertension of pregnancy   HTN in pregnancy, chronic   Pre-eclampsia   Gestational diabetes  Additional problems: None     Discharge diagnosis: Term Pregnancy Delivered, CHTN with superimposed preeclampsia and GDM A2                                                                                                Post partum procedures:none  Augmentation: Cytotec and Foley Balloon  Complications: None  Hospital course:  Induction of Labor With Vaginal Delivery   38y.o. yo [phone] 330w4das admitted to the hospital 07/13/2019 for induction of labor.  Indication for induction: Preeclampsia and A2 DM.  Patient had an uncomplicated labor course as follows. Patient presented to L&D for IOL secondary to pre-E without severe features superimposed on cHTN. Patient also with hx of A2DM. Initial SVE: 2/thick/-3. She received Cytotec and Foley. She received an Epidural. Patient received adequate GBS ppx with PCN. She then progressed to complete.  Membrane Rupture Time/Date: 12:35 AM ,07/16/2019   Intrapartum Procedures: Episiotomy: None [1]                                         Lacerations:  None [1]  Patient had delivery of a Viable infant.  Information for the patient's newborn:  DzVivienne, Sangiovanni0[[id number]]Delivery Method: Vaginal, Spontaneous(Filed from Delivery Summary)    07/16/2019  Details of delivery can be found in separate delivery note.  Patient had a routine postpartum course. Patient is discharged home 07/18/19.  Magnesium Sulfate recieved: No BMZ received: No  Physical exam  Vitals:   07/17/19 0554  07/17/19 1459 07/17/19 2109 07/18/19 0539  BP: 115/70 124/75 100/64 119/68  Pulse: 97 88 87 84  Resp: 19 16 18 18   Temp: 98.1 F (36.7 C) 98.4 F (36.9 C) 98.7 F (37.1 C) 98 F (36.7 C)  TempSrc: Oral Oral Oral Oral  SpO2: 99% 100%  100%  Weight:      Height:       General: alert, cooperative and no distress Lochia: appropriate Uterine Fundus: firm Incision: N/A DVT Evaluation: No evidence of DVT seen on physical exam. Labs: Lab Results  Component Value Date   WBC 18.6 (H) 07/16/2019   HGB 11.3 (L) 07/16/2019   HCT 34.7 (L) 07/16/2019   MCV 88.1 07/16/2019   PLT 165 07/16/2019   CMP Latest Ref Rng & Units 07/14/2019  Glucose 70 - 99 mg/dL 80  BUN 6 - 20 mg/dL 12  Creatinine 0.44 - 1.00 mg/dL 0.70  Sodium 135 - 145 mmol/L 136  Potassium 3.5 - 5.1 mmol/L 3.9  Chloride 98 - 111  mmol/L 108  CO2 22 - 32 mmol/L 18(L)  Calcium 8.9 - 10.3 mg/dL 8.7(L)  Total Protein 6.5 - 8.1 g/dL 5.4(L)  Total Bilirubin 0.3 - 1.2 mg/dL 0.5  Alkaline Phos 38 - 126 U/L 155(H)  AST 15 - 41 U/L 19  ALT 0 - 44 U/L 25    Discharge instruction: per After Visit Summary and "Baby and Me Booklet".  After visit meds:  Allergies as of 07/18/2019      Reactions   Proanthocyanidin Anaphylaxis   Including grape juice, extracts, etc., reaction, throat gets scratchy, Pt gets short of breath, breaks out in rash also.      Medication List    STOP taking these medications   aspirin EC 81 MG tablet   glyBURIDE 2.5 MG tablet Commonly known as: DIABETA   metFORMIN 500 MG tablet Commonly known as: GLUCOPHAGE   nebivolol 5 MG tablet Commonly known as: Bystolic     TAKE these medications   acetaminophen 500 MG tablet Commonly known as: TYLENOL Take 1,000 mg by mouth every 6 (six) hours as needed.   blood glucose meter kit and supplies Dispense based on patient and insurance preference. Check blood sugar four times daily as directed. (FOR ICD-10 E10.9, E11.9).   ibuprofen 600 MG  tablet Commonly known as: ADVIL Take 1 tablet (600 mg total) by mouth every 8 (eight) hours as needed for mild pain.   NIFEdipine 30 MG 24 hr tablet Commonly known as: ADALAT CC Take 1 tablet (30 mg total) by mouth daily.   PEPCID AC PO Take by mouth 2 (two) times a day.   prenatal vitamin w/FE, FA 27-1 MG Tabs tablet Take 1 tablet by mouth daily at 12 noon.   TUMS PO Take by mouth as needed.       Diet: routine diet  Activity: Advance as tolerated. Pelvic rest for 6 weeks.   Outpatient follow up:4 weeks Follow up Appt: Future Appointments  Date Time Provider [place]  07/25/2019  1:45 PM [facility] NURSE [facility]  08/23/2019 10:50 AM Roma Schanz, CNM [facility]   Follow up Visit:   Please schedule this patient for Postpartum visit in: 4 weeks with the following provider: Any provider High risk pregnancy complicated by: cHTN with superimposed Pre-E and A2DM Delivery mode:  SVD Anticipated Birth Control:  Nexplanon PP Procedures needed: 2 hour GTT and BP check  Schedule Integrated BH visit: yes      Newborn Data: Live born female  Birth Weight: 6 lb 14.1 oz (3121 g) APGAR: 8, 9  Newborn Delivery   Birth date/time: 07/16/2019 11:26:00 Delivery type: Vaginal, Spontaneous      Baby Feeding: Breast Disposition:home with mother   07/18/2019 Fatima Blank, CNM

## 2019-07-16 NOTE — Progress Notes (Signed)
Labor Progress Note Mary Hardy is a 38 y.o. [phone] at [redacted]w[redacted]d presented for IOL for preeclampsia, GDMA2.  S: significant discomfort with contractions. Can tolerate ok with fentanyl. Received one dose of stadol/phenergan.  O:  BP (!) 144/86   Pulse 77   Temp 98.9 F (37.2 C) (Oral)   Resp 18   Ht 5\' 2"  (1.575 m)   Wt 95.7 kg   LMP 10/13/2018 (Approximate)   SpO2 94%   BMI 38.59 kg/m  EFM: 120/moderate var/accels  CVE:  Dilation: 5 Effacement (%): 30 Cervical Position: Middle Station: 0 Presentation: Vertex Exam by:: Dr Pilar Plate    A&P: 38 y.o. [id number] [redacted]w[redacted]d here for IOL for pre-e and GDMA2.  #Labor: Progressing on cytotec x3 (2 vaginal, 1 buccal).  S/p foley baloon. Due to minimal effacement, will give one more cytotec and reassess before transitioning to Pit. #Pain: IV fentanyl for now. Epidural upon request #FWB: category I #GBS positive on Penicillin #GDMA2: last CBG 91.  Will continue to monitor and provide her am metformin and glyburide. #Pre-e on cHTN: last BP 113/69. Continue to monitor. No red flags. Continue daily nebivolol.   Matilde Haymaker, MD 4:59 AM

## 2019-07-16 NOTE — Anesthesia Preprocedure Evaluation (Signed)
Anesthesia Evaluation  Patient identified by MRN, date of birth, ID band Patient awake    Reviewed: Allergy & Precautions, H&P , NPO status , Patient's Chart, lab work & pertinent test results, reviewed documented beta blocker date and time   Airway Mallampati: III  TM Distance: >3 FB Neck ROM: full    Dental no notable dental hx. (+) Teeth Intact   Pulmonary neg pulmonary ROS, former smoker,    Pulmonary exam normal breath sounds clear to auscultation       Cardiovascular hypertension, Pt. on medications and Pt. on home beta blockers Normal cardiovascular exam Rhythm:regular Rate:Normal     Neuro/Psych    GI/Hepatic negative GI ROS, Neg liver ROS,   Endo/Other  diabetes, Oral Hypoglycemic Agents  Renal/GU      Musculoskeletal   Abdominal (+) + obese,   Peds  Hematology  (+) Blood dyscrasia, anemia ,   Anesthesia Other Findings   Reproductive/Obstetrics (+) Pregnancy                             Anesthesia Physical Anesthesia Plan  ASA: III  Anesthesia Plan: Epidural   Post-op Pain Management:    Induction:   PONV Risk Score and Plan:   Airway Management Planned:   Additional Equipment:   Intra-op Plan:   Post-operative Plan:   Informed Consent: I have reviewed the patients History and Physical, chart, labs and discussed the procedure including the risks, benefits and alternatives for the proposed anesthesia with the patient or authorized representative who has indicated his/her understanding and acceptance.       Plan Discussed with:   Anesthesia Plan Comments:         Anesthesia Quick Evaluation

## 2019-07-17 NOTE — Anesthesia Postprocedure Evaluation (Signed)
Anesthesia Post Note  Patient: Mary Hardy  Procedure(s) Performed: AN AD [place]     Patient location during evaluation: Mother Baby Anesthesia Type: Epidural Level of consciousness: awake and alert, oriented and patient cooperative Pain management: pain level controlled Vital Signs Assessment: post-procedure vital signs reviewed and stable Respiratory status: spontaneous breathing Cardiovascular status: stable Postop Assessment: no headache, epidural receding, patient able to bend at knees and no signs of nausea or vomiting Anesthetic complications: no    Last Vitals:  Vitals:   07/17/19 0025 07/17/19 0554  BP: (!) 144/88 115/70  Pulse: 98 97  Resp: 18 19  Temp: 36.6 C 36.7 C  SpO2: 98% 99%    Last Pain:  Vitals:   07/17/19 0554  TempSrc: Oral  PainSc:    Pain Goal:                   [facility]

## 2019-07-17 NOTE — Progress Notes (Signed)
POSTPARTUM PROGRESS NOTE  Post Partum Day 1  Subjective:  Millisa Giarrusso is a 38 y.o. [id number] s/p NSVD at [redacted]w[redacted]d after IOL for pre e superimposed on cHTN and A2DM (PO meds).  She reports she is doing well. No acute events overnight. She denies any problems with ambulating, voiding or po intake. Denies nausea or vomiting.  Pain is well controlled.  Lochia is diminshing. She denies headache, vision changes, chest pain, SOB, LE edema, RUQ pain.   Objective: Blood pressure 115/70, pulse 97, temperature 98.1 F (36.7 C), temperature source Oral, resp. rate 19, height 5\' 2"  (1.575 m), weight 95.7 kg, last menstrual period 10/13/2018, SpO2 99 %.  Physical Exam:  General: alert, cooperative and no distress Chest: no respiratory distress Heart:regular rate, distal pulses intact Abdomen: soft, nontender,  Uterine Fundus: firm, appropriately tender DVT Evaluation: No calf swelling or tenderness Extremities: mild edema Skin: warm, dry  Recent Labs    07/16/19 0927 07/16/19 1315  HGB 11.5* 11.3*  HCT 34.7* 34.7*    Assessment/Plan: Jamyah Folk is a 38 y.o. [id number] s/p NSVD at [redacted]w[redacted]d after IOL for pre e superimposed on cHTN and A2DM (PO meds)  PPD#1 - Doing well  Routine postpartum care #PreE SI on cHTN: mild range BP's, asymptomatic, cont nifed 30 daily #GDM: 2hr GTT at 6 wk visit Contraception: nexplanon prior to discharge, pending Feeding: breast Dispo: Plan for discharge PPD#2.   LOS: 4 days   Augustin Coupe, MD/[facility] OB Fellow  07/17/2019, 12:45 PM

## 2019-07-18 ENCOUNTER — Ambulatory Visit: Payer: Self-pay

## 2019-07-18 LAB — GLUCOSE, CAPILLARY
Glucose-Capillary: 34 mg/dL — CL (ref 70–99)
Glucose-Capillary: 42 mg/dL — CL (ref 70–99)

## 2019-07-18 MED ORDER — ETONOGESTREL 68 MG ~~LOC~~ IMPL
68.0000 mg | DRUG_IMPLANT | Freq: Once | SUBCUTANEOUS | Status: AC
  Administered 2019-07-18: 13:00:00 68 mg via SUBCUTANEOUS
  Filled 2019-07-18: qty 1

## 2019-07-18 MED ORDER — IBUPROFEN 600 MG PO TABS: 600.0000 mg | ORAL_TABLET | Freq: Three times a day (TID) | ORAL | 0 refills | Status: DC | PRN

## 2019-07-18 MED ORDER — NIFEDIPINE ER 30 MG PO TB24: 30.0000 mg | ORAL_TABLET | Freq: Every day | ORAL | 2 refills | Status: AC

## 2019-07-18 MED ORDER — LIDOCAINE HCL 1 % IJ SOLN
0.0000 mL | Freq: Once | INTRAMUSCULAR | Status: AC | PRN
  Administered 2019-07-18: 2.5 mL via INTRADERMAL
  Filled 2019-07-18: qty 20

## 2019-07-18 MED FILL — NIFEdipine ER 30 MG TB24: 30 | 30 days supply | Qty: 30 | Fill #0

## 2019-07-18 NOTE — Progress Notes (Signed)
Post-Placental Nexplanon Insertion Procedure Note  Patient was identified. Informed consent was signed, signed copy in chart. A time-out was performed.    The insertion site was identified 8-10 cm (3-4 inches) from the medial epicondyle of the humerus and 3-5 cm (1.25-2 inches) posterior to (below) the sulcus (groove) between the biceps and triceps muscles of the patient's left arm and marked. The site was prepped and draped in the usual sterile fashion. Pt was prepped with alcohol swab and then injected with 3 cc of 1% lidocaine. The site was prepped with betadine. Nexplanon removed form packaging,  Device confirmed in needle, then inserted full length of needle and withdrawn per handbook instructions. Provider and patient verified presence of the implant in the woman's arm by palpation. Pt insertion site was covered with steristrips/adhesive bandage and pressure bandage. There was minimal blood loss. Patient tolerated procedure well.  Patient was given post procedure instructions and Nexplanon user card with expiration date. Condoms were recommended for STI prevention. Patient was asked to keep the pressure dressing on for 24 hours to minimize bruising and keep the adhesive bandage on for 3-5 days. The patient verbalized understanding of the plan of care and agrees.   Lot # [id number] Expiration Date 10/03/2021  

## 2019-07-18 NOTE — Progress Notes (Signed)
Post Partum Day 2 Subjective: Mary Hardy is a 38 y.o. [id number] s/p NSVD at [redacted]w[redacted]d after IOL for pre e superimposed on cHTN and A2DM (PO meds). Pt doing well post delivery. Reports baby was admitted to NICU yesterday for hypoglycemia, hypothermia and because she had been regurgitating post feeding. Has been visiting her frequently. Having occasional mild cramping and back ache and some burning pain down below. Lochia is tapering off, denies clots. Changing yellow pad every 3-4 hrs. Is happy to be discharged today. She will stay in a local hotel and visit her baby in the NICU.  Denies chest pain, SOB, fevers or LE edema.  Objective: Blood pressure 119/68, pulse 84, temperature 98 F (36.7 C), temperature source Oral, resp. rate 18, height 5\' 2"  (1.575 m), weight 95.7 kg, last menstrual period 10/13/2018, SpO2 100 %.  Physical Exam:  General: alert, cooperative and appears stated age  Cardiac: RRR, no rubs or gallops, cap refill normal Pulm: chest clear on auscultation,  No crackles or wheeze, no respiratory distress GI: abdo soft, mild generalized tenderness, bowel sounds present  Lochia: appropriate Uterine Fundus: firm Incision: n/a DVT Evaluation: No cords or calf tenderness. No significant calf/ankle edema.  Recent Labs    07/16/19 0927 07/16/19 1315  HGB 11.5* 11.3*  HCT 34.7* 34.7*    Assessment/Plan: Mary Hardy is a 38 y.o. [id number] s/p NSVD at [redacted]w[redacted]d after IOL for pre e superimposed on cHTN and A2DM (PO meds)  PPD#2-doing well, routine postpartum care #PreE SI on cHTN: BP 119/68, asymptomatic, cont nifed 30mg  daily #Contraception: Nexaplanon prior to discharge  #Feeding: Breast and bottle feeding #Dispo-Plan for discharge today   LOS: 5 days   [name] 07/18/2019, 9:14 AM

## 2019-07-18 NOTE — Lactation Note (Signed)
This note was copied from a baby's chart. Lactation Consultation Note  Patient Name: Girl Sumner Boesch BJYNW'G Date: 07/18/2019 Reason for consult: Follow-up assessment;1st time breastfeeding;Early term 37-38.6wks;NICU baby  Baby is 75 hours old  Mom resting in bed - per mom relaxin her back.  Per mom breast are fuller and warmer. LC reassured mom that is a good sign for her milk coming in.  Sore nipple and engorgement prevention and tx reviewed.  LC stressed the importance of hand expressing before and after pumping,  And continue the consistent pumping around the clock.  Per mom still has to call her insurance company for her DEBP or rent one if  its going  to take awhile for the DEBP to be delivered.  Mom aware she can call with questions and when the baby is latching at the breast can have her NICU RN call for assessment.     Maternal Data Has patient been taught Hand Expression?: Yes(per mom is working on it)  Feeding Feeding Type: Formula  LATCH Score                   Interventions Interventions: Breast feeding basics reviewed;DEBP  Lactation Tools Discussed/Used Tools: Pump;Flanges Flange Size: 24;27 Breast pump type: Double-Electric Breast Pump Pump Review: Milk Storage   Consult Status Consult Status: PRN Follow-up type: In-patient(baby in NICU)    [place] 07/18/2019, 9:30 AM

## 2019-07-18 NOTE — Lactation Note (Signed)
This note was copied from a baby's chart. Lactation Consultation Note  Patient Name: Mary Hardy NUUVO'[id number] Date: 07/18/2019     NICU RN called.  Mom is without a DEBP until hers is delivered through insurance.  Gift shop closed for access to rental.  [place] provided dad with manual pump and explained parts, cleaning, milk storage.  Encouraged dad to have mom use hospital DEBP while visiting infant in NICU. LC also encouraged dad to have mom call if further questions arise.     Maternal Data    Feeding Feeding Type: Formula Nipple Type: Slow - flow  LATCH Score                   Interventions    Lactation Tools Discussed/Used     Consult Status      [name] [facility] 07/18/2019, 6:24 PM

## 2019-07-18 NOTE — Progress Notes (Signed)
Pharmacy brought up Nifedipine  BP med for home use. Pt states understanding of how to take home medication

## 2019-07-18 NOTE — Clinical Social Work Maternal (Signed)
CLINICAL SOCIAL WORK MATERNAL/CHILD NOTE  Patient Details  Name: Mary Hardy MRN: [id number] Date of Birth: 06/04/1981  Date:  07/18/2019  Clinical Social Worker Initiating Note:  Kerby Borner, LCSW     Date/Time: Initiated:  07/18/19/1134             Child's Name:  Mary Hardy   Biological Parents:  Mother, Father(Father: Mary Hardy)   Need for Interpreter:  None   Reason for Referral:  Current Substance Use/Substance Use During Pregnancy , Other (Comment)(NICU admission)   Address:  [address] [place] [address]    Phone number:  [phone] (home)     Additional phone number:   Household Members/Support Persons (HM/SP):   Household Member/Support Person 1, Household Member/Support Person 2   HM/SP Name Relationship DOB or Age  HM/SP -1 Mary Hardy Husband/FOB    HM/SP -2 Mary Hardy daughter 08-12-13  HM/SP -3        HM/SP -4        HM/SP -5        HM/SP -6        HM/SP -7        HM/SP -8          Natural Supports (not living in the home):     Professional Supports:None   Employment:Unemployed   Type of Work:     Education:  Attending college   Homebound arranged:    Financial Resources:Private Insurance   Other Resources:     Cultural/Religious Considerations Which May Impact Care:   Strengths: Ability to meet basic needs , Home prepared for child , Understanding of illness, Pediatrician chosen   Psychotropic Medications:         Pediatrician:    [place] area  Pediatrician List:   [facility] [place] (Peds)  [place]    [place]    [place]    [place]    [place]      Pediatrician Fax Number:    Risk Factors/Current Problems: None   Cognitive State: Able to Concentrate , Alert , Linear Thinking , Insightful , Goal Oriented    Mood/Affect: Interested , Relaxed , Calm    CSW Assessment:CSW met  with [name] at bedside to discuss infant's NICU admission and substance use during pregnancy. CSW introduced self and explained reason consult. [name] was welcoming, polite and engaged during assessment. [name] reported that she resides with her Husband/FOB and daughter. [name] reported that she is currently enrolled in college and studying forensic accounting. [name] reported that she has all items needed to care for infant including a car seat and crib. CSW inquired about [name]'s support system, [name] reported that her husband is her only support because she just moved to [place] one year ago.   CSW inquired about [name]'s mental health history, [name] reported that she was diagnosed with depression in 2007 due to situational stressors. [name] reported that she was prescribed lexapro and her depression resolved once her stressors went away. [name] reported that she experienced postpartum depression with her daughter in 2014. [name] reported that her daughter was born at 33 weeks and she was not able to see her for weeks. [name] reported that her postpartum depression lasted for eight or nine months and that she took lexapro. [name] reported that she cant recall whether time made her feel better or the medication. [name] denied any current depressive symptoms. [name] presented calm and did not demonstrate any acute mental health signs/symptoms. CSW assessed   for safety, [name] denied SI, HI and domestic violence.  CSW provided education regarding the baby blues period vs. perinatal mood disorders, discussed treatment and gave resources for mental health follow up if concerns arise.  CSW recommends self-evaluation during the postpartum time period using the New Mom Checklist from Postpartum Progress and encouraged [name] to contact a medical professional if symptoms are noted at any time.    CSW provided review of Sudden Infant Death Syndrome (SIDS) precautions. [name] verbalized understanding and reported that infant would sleep in her crib.   CSW and [name] discussed infant's NICU admission. [name] reported  that the NICU admission is going good and that she feels well informed about infant's care. [name] reported that this experience is much better than her experience with her older daughter's NICU admission. [name] reported that she plans to stay at a local hotel until infant is discharged. CSW informed [name] about the NICU, what to expect and resources/supports available while infant is admitted to the NICU. [name] reported that meal vouchers would be helpful, CSW agreed to leave 4 meal vouchers in infant's room. [name] denied any additional needs/concerns.  CSW informed [name] about hospital drug policy due to substance use during pregnancy. [name] reported that she took a trip to [place] where marijuana is legal and was unaware that she was pregnant at the time. [name] denied any other substance use during pregnancy. CSW informed [name] that infant's UDS was negative and CDS would continue to be monitored a CPS report would be made if warranted. [name] verbalized understanding and denied any questions or concerns.   CSW will continue to offer resources/supports while infant is admitted to the NICU.   CSW Plan/Description:  Sudden Infant Death Syndrome (SIDS) Education, Perinatal Mood and Anxiety Disorder (PMADs) Education, Other Patient/Family Education, [place], CSW Will Continue to Monitor Umbilical Cord Tissue Drug Screen Results and Make Report if Barbette Or, LCSW 07/18/2019, 11:36 AM

## 2019-07-18 NOTE — Progress Notes (Signed)
CSW placed 4 meal vouchers in infant's room.   [place], LCSW Clinical Social Worker [facility] Cell#: [phone]  

## 2019-07-19 ENCOUNTER — Other Ambulatory Visit: Payer: No Typology Code available for payment source

## 2019-07-19 ENCOUNTER — Encounter: Payer: No Typology Code available for payment source | Admitting: Women's Health

## 2019-07-22 ENCOUNTER — Other Ambulatory Visit: Payer: No Typology Code available for payment source

## 2019-07-25 ENCOUNTER — Telehealth: Payer: No Typology Code available for payment source | Admitting: *Deleted

## 2019-07-25 VITALS — BP 128/101 | HR 81

## 2019-07-25 DIAGNOSIS — Z0130 Encounter for examination of blood pressure without abnormal findings: Secondary | ICD-10-CM

## 2019-07-25 NOTE — Progress Notes (Signed)
I connected with  Mary Hardy on 07/25/19 by a video enabled telemedicine application and verified that I am speaking with the correct person using two identifiers.   I discussed the limitations of evaluation and management by telemedicine. The patient expressed understanding and agreed to proceed.  These are the readings from the past day.  134/98 128/97 123/86  Discussed with KRB and patient advised to increase Procardia to 30mg  BID and to check blood pressure 4 times daily.  Advised to let us know if BP starts to drop too much.  Will need to stop medication 2 days prior to post partum visit. Pt verbalized understanding.

## 2019-07-26 ENCOUNTER — Other Ambulatory Visit: Payer: No Typology Code available for payment source

## 2019-07-26 ENCOUNTER — Encounter: Payer: No Typology Code available for payment source | Admitting: *Deleted

## 2019-07-28 ENCOUNTER — Encounter: Payer: Self-pay | Admitting: *Deleted

## 2019-07-29 ENCOUNTER — Other Ambulatory Visit: Payer: No Typology Code available for payment source

## 2019-08-11 ENCOUNTER — Ambulatory Visit: Payer: Self-pay

## 2019-08-11 NOTE — Lactation Note (Signed)
This note was copied from a baby's chart. Lactation Consultation Note  Patient Name: Mary Hardy [id number] Date: 08/11/2019 Reason for consult: Follow-up assessment  LC Follow Up Visit:  Mother requested lactation visit today, however, when I arrived for the 1100 feeding mother was not here.  RN in room feeding baby.  She stated that mother will probably arrive approximately 1500 today.  If LC not available or mother not interested in having assistance today, we could set up a consult for tomorrow.  RN will let me know mother's preference.   Maternal Data    Feeding Feeding Type: Breast Milk Nipple Type: Dr. Lavena Bullion  [facility] Score                   Interventions    Lactation Tools Discussed/Used     Consult Status Consult Status: PRN Date: 08/11/19 Follow-up type: Call as needed    Verner Kopischke R Rhondalyn Clingan 08/11/2019, 11:46 AM

## 2019-08-12 ENCOUNTER — Ambulatory Visit: Payer: Self-pay

## 2019-08-12 IMAGING — CT CT ANGIOGRAPHY CHEST
2 of 7 series · 18 of 46 positions shown · IV contrast (APPLIED)
Comparison: Chest radiographs dated 04/29/2019.

CLINICAL DATA: Shortness of breath with minimal activity.

EXAM:
CT ANGIOGRAPHY CHEST WITH CONTRAST
TECHNIQUE: Multidetector CT imaging of the chest was performed using the
standard protocol during bolus administration of intravenous
contrast. Multiplanar CT image reconstructions and MIPs were
obtained to evaluate the vascular anatomy.
CONTRAST:  75mL OMNIPAQUE IOHEXOL 350 MG/ML SOLN

[Series 8: thins · axial · 0.76mm/px · z∈[-387,-163]mm · 15 of 362 slices shown]
[im 21/362  lung]
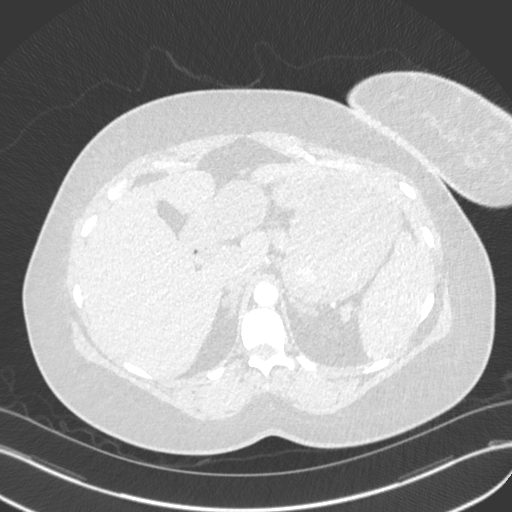
[im 41/362  soft-tissue]
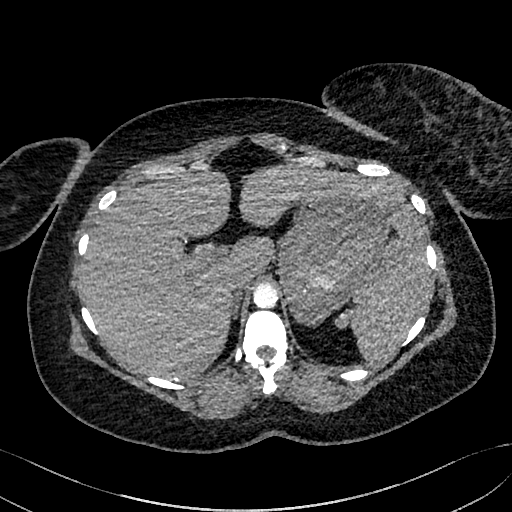
[im 61/362  lung]
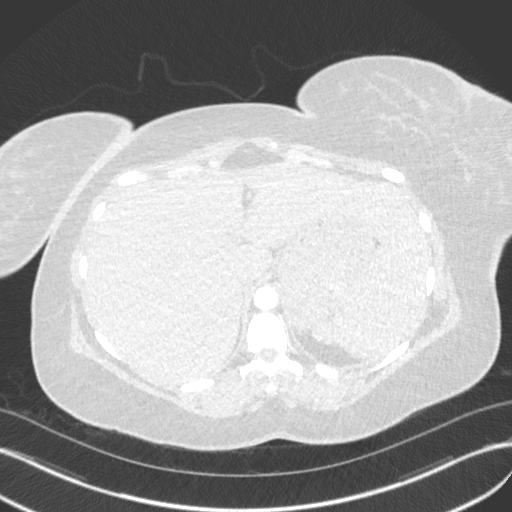
[im 81/362  soft-tissue]
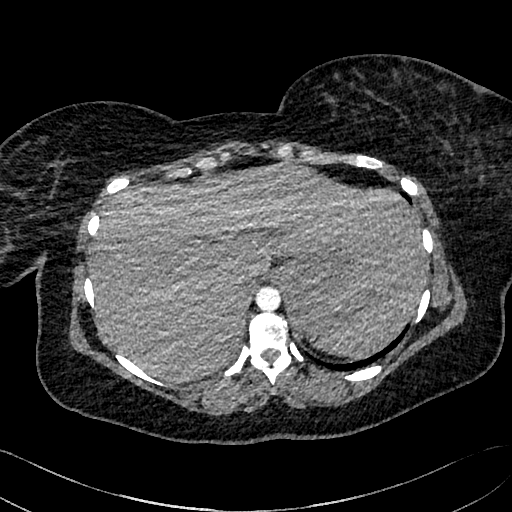
[im 121/362  lung]
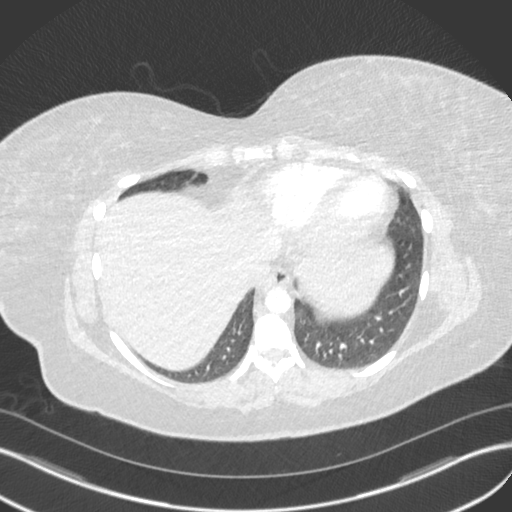
[im 141/362  soft-tissue]
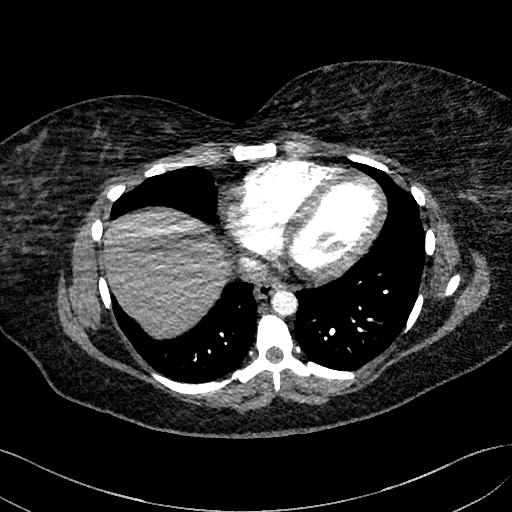
[im 161/362  lung]
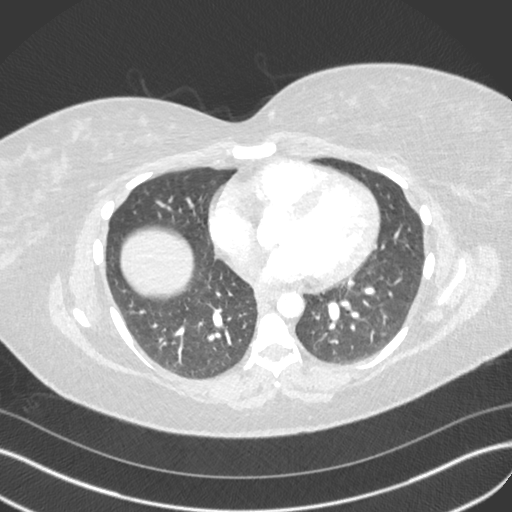
[im 181/362  soft-tissue]
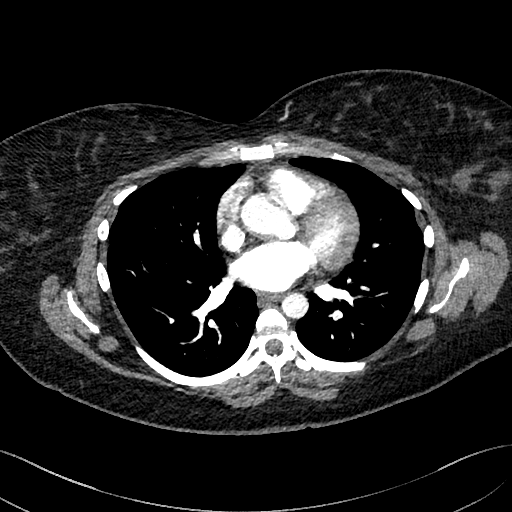
[im 201/362  lung]
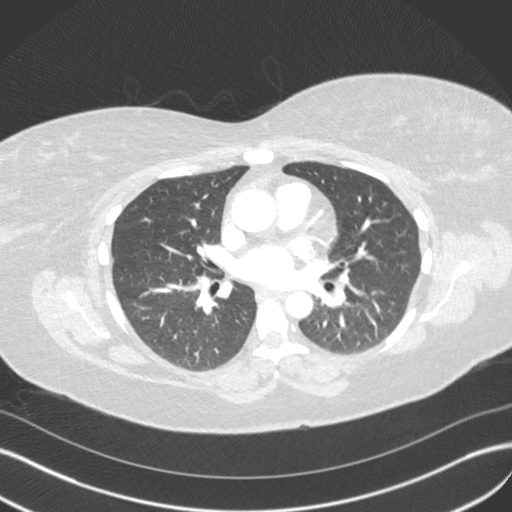
[im 221/362  soft-tissue]
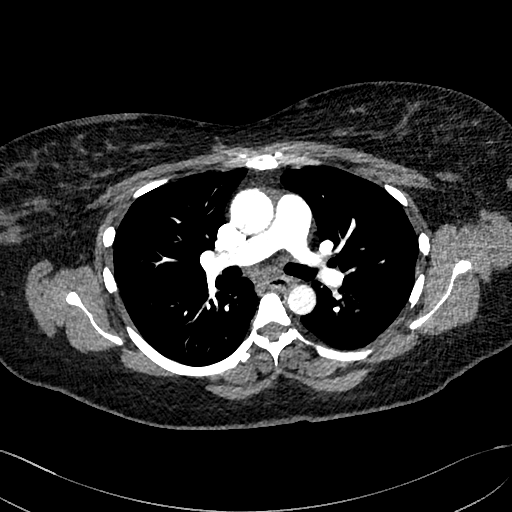
[im 241/362  lung]
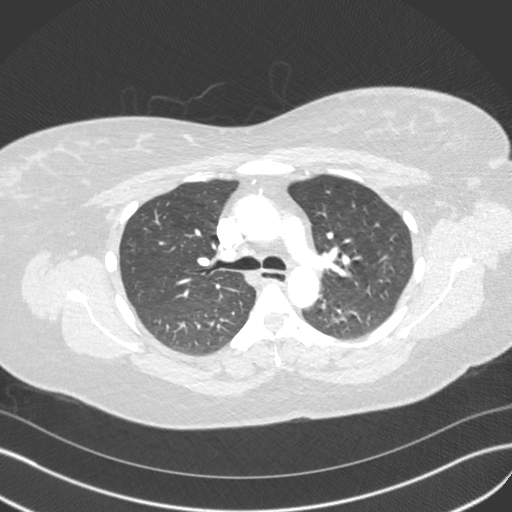
[im 281/362  soft-tissue]
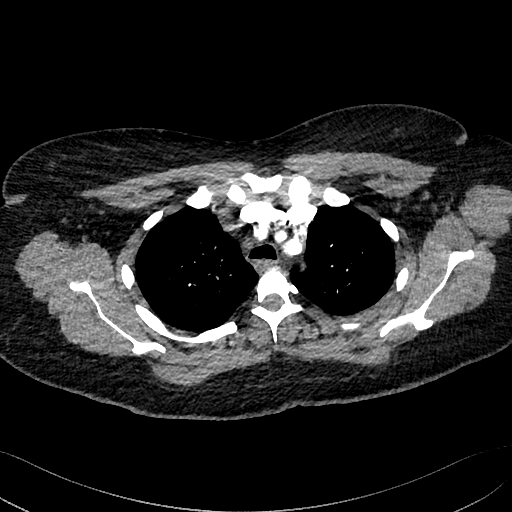
[im 301/362  lung]
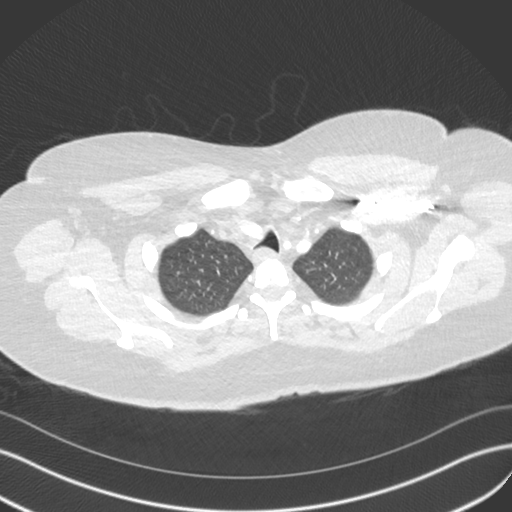
[im 321/362  soft-tissue]
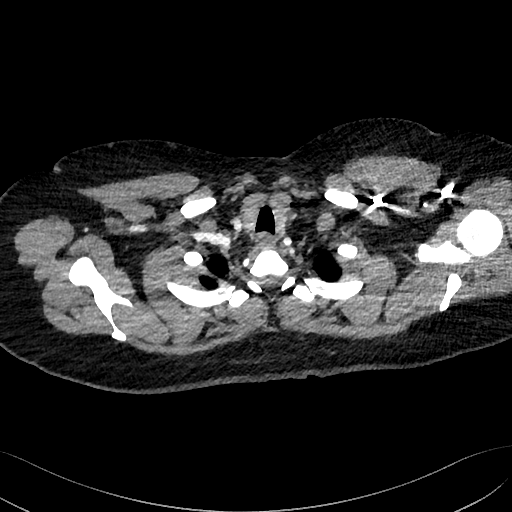
[im 341/362  lung]
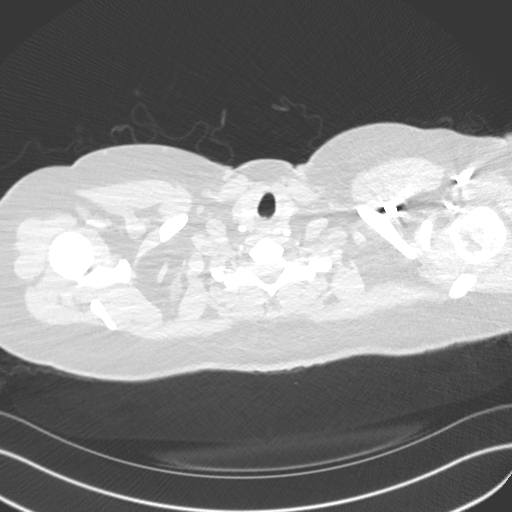

[Series 9: cor · coronal · 0.51mm/px · 3 of 134 slices shown]
[im 34/134  soft-tissue]
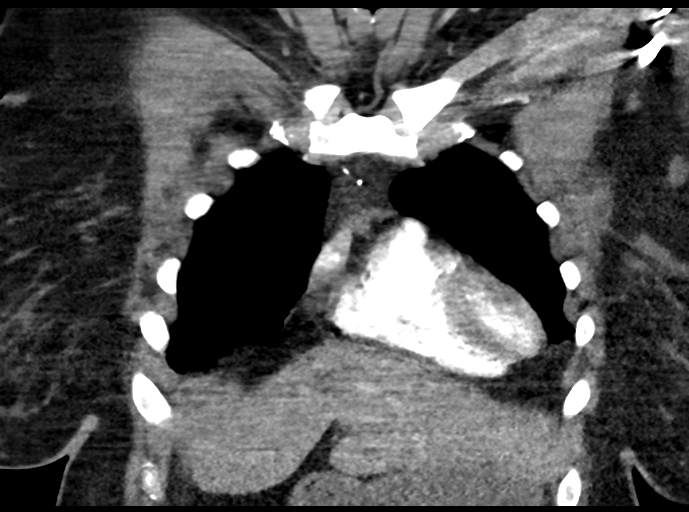
[im 67/134  soft-tissue]
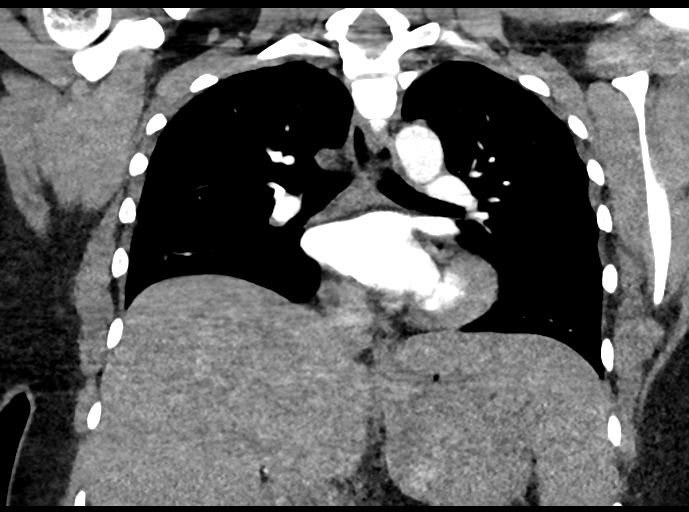
[im 100/134  soft-tissue]
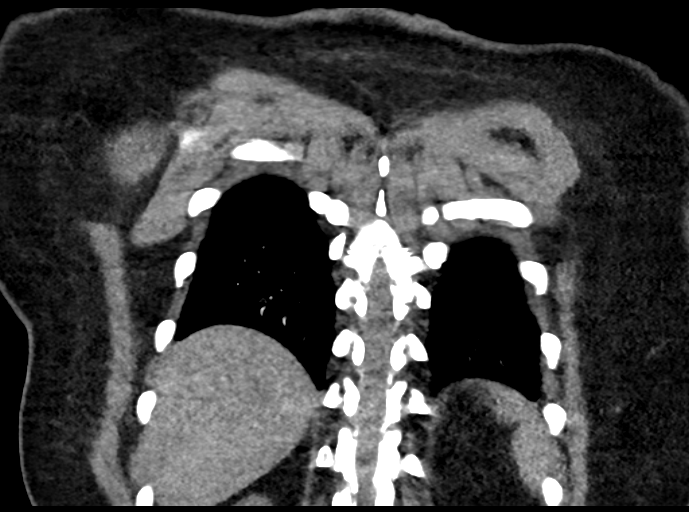

[18 of 46 positions shown; findings below may reference images not displayed]

FINDINGS: Cardiovascular: Normally opacified pulmonary arteries with no
pulmonary arterial filling defects seen. Normal sized heart.

Mediastinum/Nodes: No enlarged mediastinal, hilar, or axillary lymph
nodes. Thyroid gland, trachea, and esophagus demonstrate no
significant findings.

Lungs/Pleura: Lungs are clear. No pleural effusion or pneumothorax.

Upper Abdomen: Cholecystectomy clips.

Musculoskeletal: Mild thoracic and lower cervical spine degenerative
changes.

Review of the MIP images confirms the above findings.
IMPRESSION: No pulmonary emboli or acute abnormality.

## 2019-08-12 IMAGING — DX CHEST - 2 VIEW
2 series · 2 of 2 positions shown · non-contrast
Comparison: None.

CLINICAL DATA: Shortness of breath

EXAM:
CHEST - 2 VIEW

[chest pa]
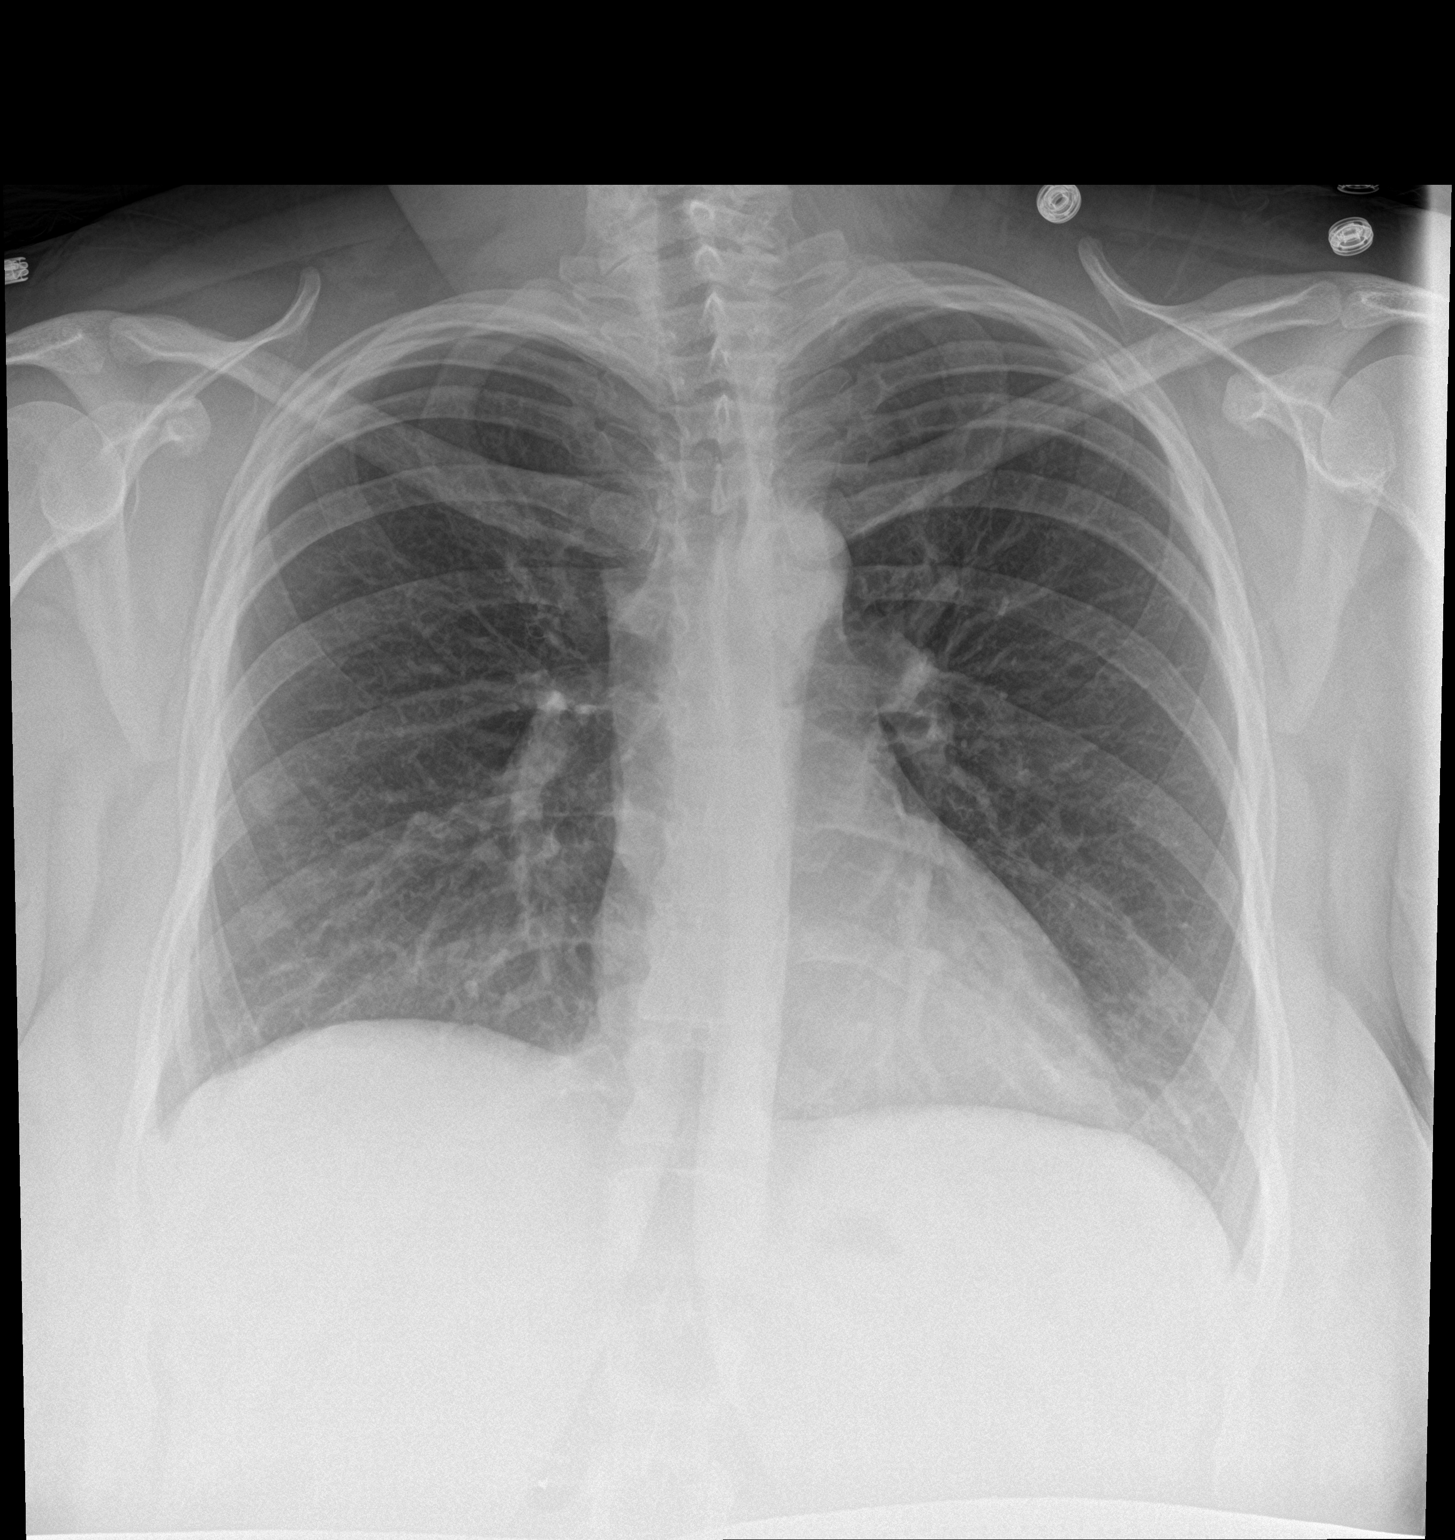

[chest lat]
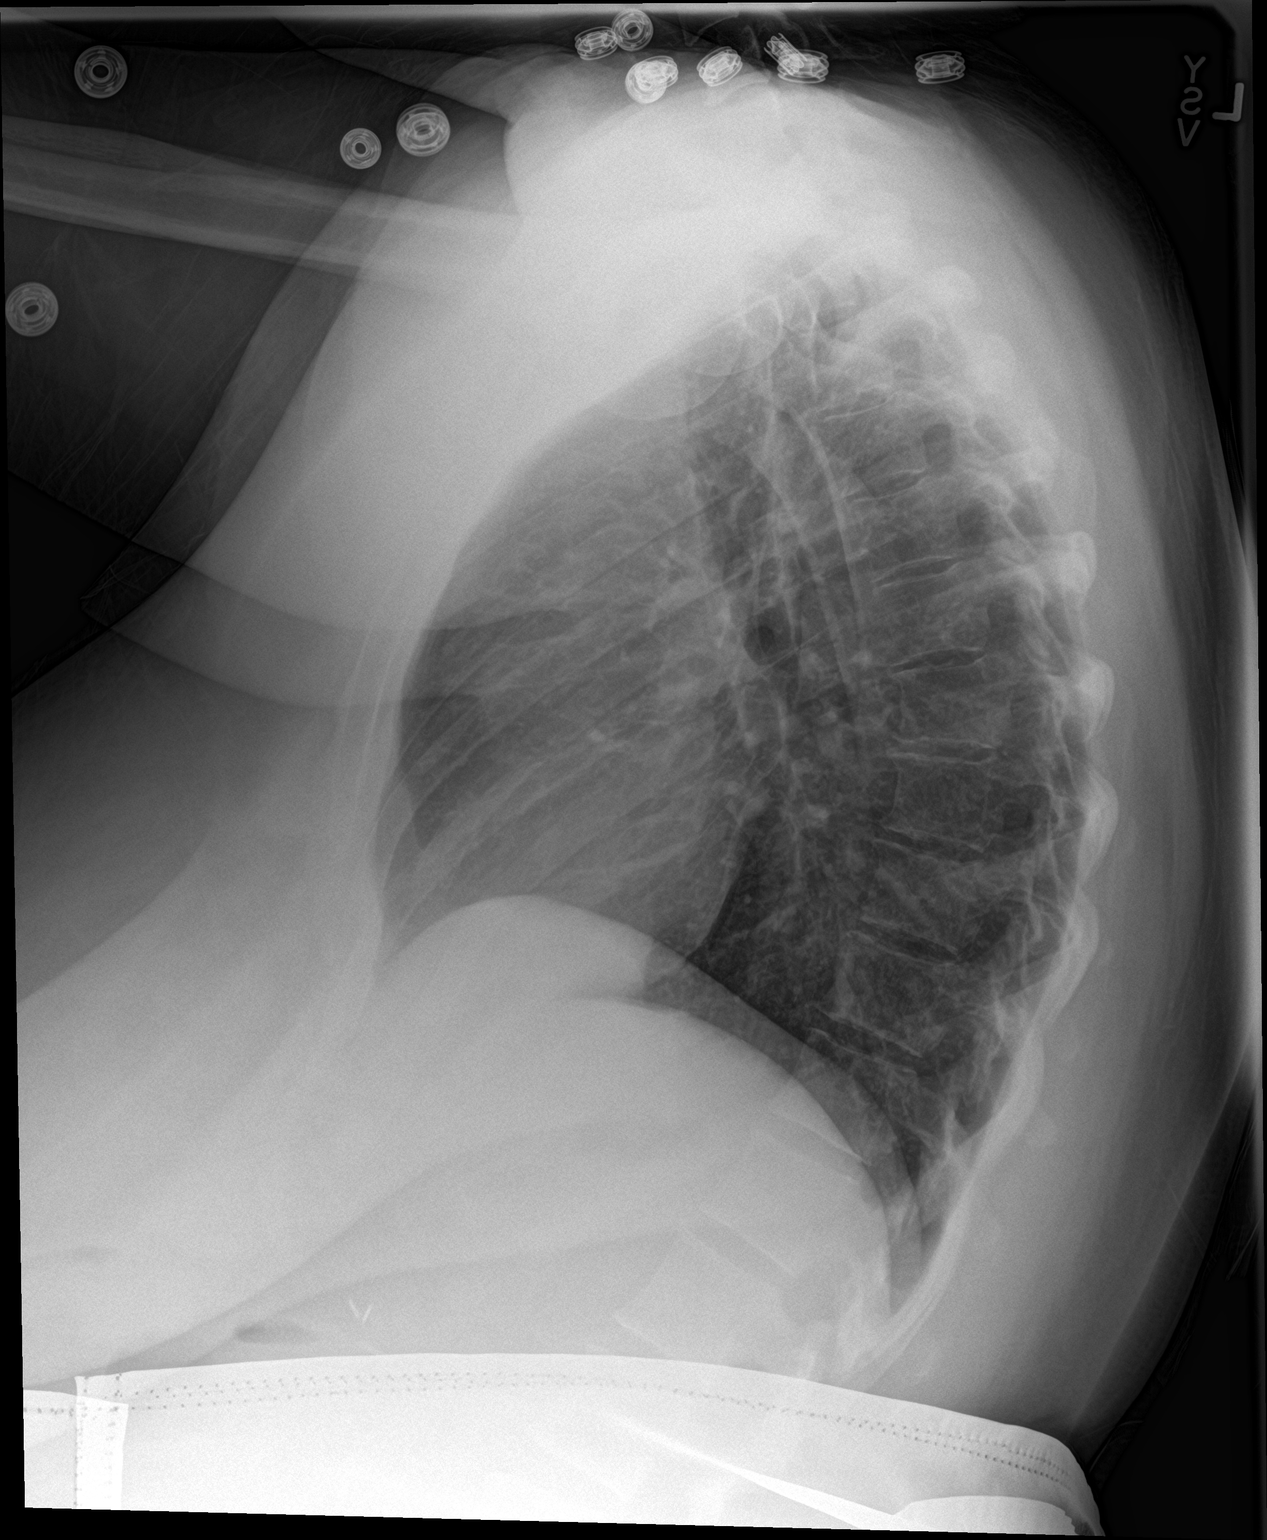

[2 of 2 positions shown; findings below may reference images not displayed]

FINDINGS: Lungs are clear. Heart size and pulmonary vascularity are normal. No
adenopathy. No bone lesions.
IMPRESSION: No edema or consolidation.

## 2019-08-12 NOTE — Lactation Note (Signed)
This note was copied from a baby's chart. Lactation Consultation Note  Patient Name: Girl Mary Hardy [id number] Date: 08/12/2019 Reason for consult: Early term 37-38.6wks;Follow-up assessment  Visited with mother in the NICU.  This is an ETI at 37+4 weeks.  She is now 67 weeks old.  This is mother's first time attempting latching at the breast.  Baby was awake and alert when I arrived.  RN had started working with mother and getting baby ready for feeding.  Mother's breasts are soft and non tender and nipples are short shafted and everted.  Breast tissue is very compressible.  Offered to assist with latching and mother accepted.  Attempted to place baby in the cross cradle hole on mother's left breast.  Baby was able to latch but would not initiate sucking.  She became very frantic and pushed back off the breast.  Assessed her suck on my gloved finger and noted her suck to be very disorganized and weak.  She required cheek and jaw support to grasp my finger.  With my fingers providing support she was able to suck a few stronger sucks but then stopped.  Allowed her to consume 20 mls of her feeding using the curved tip syringe while sucking on my gloved finger.  Again, the suck was uncoordinated.  She has a high palate and required constant stimulation to continue to suck.    After the 20 mls I attempted to latch again to the left breast using the NS.  Baby had a wide gape but would not latch and suck.  Again, she pushed back off the breast and would not initiate sucking.  Pre-filled the NS with milk and this did not help her to latch.   Burped her and tried latching to the right breast in the football hold with the same result.  She was not interested and became fussy when trying to latch with the NS.  At this time I explained to mother that I did not want to stress baby and that it would be a good idea to hold her STS while the RN provided the rest of her feeding.  Mother understood and praised mother for  her efforts.  Pointed out the positive aspects of today's attempt and reassured mother that [facility] assistance will be available as needed.  Mother appreciative.  RN updated.     Maternal Data    Feeding Feeding Type: Breast Fed  LATCH Score Latch: Too sleepy or reluctant, no latch achieved, no sucking elicited.  Audible Swallowing: None  Type of Nipple: Everted at rest and after stimulation(short shafted)  Comfort (Breast/Nipple): Soft / non-tender  Hold (Positioning): Assistance needed to correctly position infant at breast and maintain latch.  LATCH Score: 5  Interventions Interventions: Breast feeding basics reviewed;Assisted with latch;Skin to skin;Hand express;Breast massage;Breast compression;Position options;Support pillows;Adjust position  Lactation Tools Discussed/Used     Consult Status Consult Status: PRN Date: 08/12/19 Follow-up type: Call as needed    Sabeen Piechocki R Litsy Epting 08/12/2019, 11:46 AM

## 2019-08-14 ENCOUNTER — Ambulatory Visit: Payer: Self-pay

## 2019-08-14 NOTE — Lactation Note (Signed)
This note was copied from a baby's chart. Lactation Consultation Note  Patient Name: Girl Danique Hartsough [id number]'[id number] Date: 08/14/2019   Baby in NICU.  Now 14 weeks old.  [redacted]w[redacted]d. Mother recently attempted breastfeeding and states baby was able to latch without NS for approx 10 min.  Mother had baby STS at first and was so happy baby finally latched. Set up [facility] appt for tomorrow at [place].  Arrive 10:50a. Mother states she is pumping q 4 hours and receiving 240-360 ml per session. Yesterday her other daughter had a birthday party so she did not pump as much and has less volume to give to baby today.  Discussed attempted to get 8 pumping sessions in per day to increase her volume.       Maternal Data    Feeding Feeding Type: Formula Nipple Type: Dr. Myra Gianotti [facility]  [facility] Score                   Interventions    Lactation Tools Discussed/Used     Consult Status      Vivianne Master [facility] 08/14/2019, 2:42 PM

## 2019-08-15 ENCOUNTER — Ambulatory Visit: Payer: Self-pay

## 2019-08-15 NOTE — Lactation Note (Signed)
This note was copied from a baby's chart. Lactation Consultation Note  Patient Name: Mary Hardy Date: 08/15/2019  Came to NICU for a 2:00pm assist. Mother is not here.  Baby showing no signs of hunger.  Will visit again when mom here.   Maternal Data    Feeding Feeding Type: Formula  LATCH Score                   Interventions    Lactation Tools Discussed/Used     Consult Status      Ave Filter 08/15/2019, 2:28 PM

## 2019-08-23 ENCOUNTER — Ambulatory Visit: Payer: No Typology Code available for payment source | Admitting: Women's Health

## 2019-08-29 ENCOUNTER — Ambulatory Visit: Payer: No Typology Code available for payment source | Admitting: Women's Health

## 2019-09-03 ENCOUNTER — Ambulatory Visit: Payer: Self-pay

## 2019-09-03 NOTE — Lactation Note (Signed)
This note was copied from a baby's chart. Lactation Consultation Note  Patient Name: Mary Hardy JYNWG'N Date: 09/03/2019  Entered room infant fussy and rooting at breast.  Mom reports she can breastfed on cue but they usually also do gavage feeding while she is at the breast. So its usually about every 3 hours. RN came in and put tube in place. Mom feeds in cross cradle hold and nursing pillow in her lap appears to place baby above breast. Mom reports first baby never latched.  Assist with latching baby cross cradle.  Discussed how football would be a better option for her since she does have some issues with reflux.  Explained to mom it is very important to get infants head higher than the breast while she is feeding.  Also laid back can be another position to use for infants with reflux to keep head up.  Mom Reports she has tried football but really doesn't like that position. Observed mom bf for a few minutes.  Infant latches and comes off and on the breast many many times.  She will maintain well for a few sucks and then get mad and pull away crying fussing arching.  Did this many times.  Mom reports that is typical.  Sometimes worse than others.  Showed mom how to hold and compress breast some but then it seems she cant handle to flow.   She started getting very fussy arching, coming off and on. Spit up. Gavage tube  Came out.  Got RN who turned off gavage tube.  Mom reports she also had formula prior mixed with cereal not long ago. Mom reports they are trying the thickened feeds with her again.  Inquired about moms pumping routine and what her milk looks like.  Mom reports she pumps maybe 5 times day. Doesn't pump at the hospital.  Not getting enough to completely give her breastmilk.  She is getting about half of baby's needs.  Mom reports her milk is usually very watery with just a tiny layer on top.  Asked how her breasts felt.  She reports they still hurt past pumping in her arm pit area and  she gets pain there.  Emphasized breast massage prior to and during pumping to try and get more fatty milk. Urged mom to have someone in lactation come and work with her again.  Maternal Data    Feeding Feeding Type: Breast Milk with Formula added Nipple Type: Dr. Roosvelt Harps level 4  LATCH Score                   Interventions    Lactation Tools Discussed/Used     Consult [place] 09/03/2019, 10:27 PM

## 2019-09-04 ENCOUNTER — Ambulatory Visit: Payer: Self-pay

## 2019-09-04 NOTE — Lactation Note (Signed)
This note was copied from a baby's chart. Lactation Consultation Note  Patient Name: Girl Tymia Streb JWLKH'V Date: 09/04/2019 Reason for consult: Follow-up assessment   LC Follow Up:  Attempted to visit with mother, however, she is not here at the present time.  LC will be available throughout the night if mother requests assistance.    Consult Status Consult Status: PRN Date: 09/04/19 Follow-up type: Call as needed    Paola Flynt R Arlisha Patalano 09/04/2019, 4:17 PM

## 2019-09-12 ENCOUNTER — Ambulatory Visit: Payer: Self-pay

## 2019-09-12 NOTE — Lactation Note (Signed)
This note was copied from a baby's chart. Lactation Consultation Note  Patient Name: Mary Hardy HYIFO'Y Date: 09/12/2019   Attempted to visit with mother today but she's not there. Spoke to NICU RN Bonnita Nasuti and she said the last time mother was seen visiting baby was on October 15th. Baby will have a G tube put in tomorrow and parents are expected to be present. LC will try to visit tomorrow to follow up with mom; baby is currently on breastmilk and Similac 20 calorie formula.  Maternal Data    Feeding Feeding Type: Formula Nipple Type: Dr. Roosvelt Harps level 4  [facility] Score                   Interventions    Lactation Tools Discussed/Used     Consult Status      Mary Hardy 09/12/2019, 6:27 PM

## 2020-11-01 ENCOUNTER — Encounter: Payer: Self-pay | Admitting: Emergency Medicine

## 2020-11-01 ENCOUNTER — Ambulatory Visit
Admission: EM | Admit: 2020-11-01 | Discharge: 2020-11-01 | Disposition: A | Discharge status: Home / Self Care | Payer: PRIVATE HEALTH INSURANCE | Attending: Emergency Medicine | Admitting: Emergency Medicine

## 2020-11-01 ENCOUNTER — Other Ambulatory Visit: Payer: Self-pay

## 2020-11-01 DIAGNOSIS — Z20828 Contact with and (suspected) exposure to other viral communicable diseases: Secondary | ICD-10-CM | POA: Diagnosis not present

## 2020-11-01 DIAGNOSIS — J069 Acute upper respiratory infection, unspecified: Secondary | ICD-10-CM | POA: Diagnosis not present

## 2020-11-01 MED ORDER — ALBUTEROL SULFATE HFA 108 (90 BASE) MCG/ACT IN AERS: 1.0000 | INHALATION_SPRAY | Freq: Four times a day (QID) | RESPIRATORY_TRACT | 0 refills | Status: DC | PRN

## 2020-11-01 MED ORDER — BENZONATATE 100 MG PO CAPS: 100.0000 mg | ORAL_CAPSULE | Freq: Three times a day (TID) | ORAL | 0 refills | Status: DC

## 2020-11-01 MED ORDER — CETIRIZINE HCL 10 MG PO TABS: 10.0000 mg | ORAL_TABLET | Freq: Every day | ORAL | 0 refills | Status: DC

## 2020-11-01 MED ORDER — FLUTICASONE PROPIONATE 50 MCG/ACT NA SUSP: 1.0000 | Freq: Every day | NASAL | 0 refills | Status: DC

## 2020-11-01 NOTE — Discharge Instructions (Signed)
Get plenty of rest and push fluids Tessalon Perles prescribed for cough Zyrtec for nasal congestion, runny nose, and/or sore throat Flonase for nasal congestion and runny nose ProAir air was prescribed/take as directed Use medications daily for symptom relief Use OTC medications like ibuprofen or tylenol as needed fever or pain Call or go to the ED if you have any new or worsening symptoms such as fever, worsening cough, shortness of breath, chest tightness, chest pain, turning blue, changes in mental status, etc..Marland Kitchen

## 2020-11-01 NOTE — ED Triage Notes (Signed)
Patient states that her daughter had rsv couple weeks ago. cough congestion, fever, irritation in throat rapid covid test was neg

## 2020-11-01 NOTE — ED Provider Notes (Addendum)
[place]   [id number] 11/01/20 Arrival Time: 2376   Chief Complaint  Patient presents with  . URI     SUBJECTIVE: History from: patient and family.  Mary Hardy is a 39 y.o. female who presented to the urgent care with a complaint of chills, fever cough, nasal congestion with clear nasal drainage for the past week.  Report recent sick exposure to daughter that tested positive for RSV.  Denies recent travel.  Has tried OTC medication without relief.  Denies alleviating or aggravating factors.  Denies previous symptoms in the past.   Denies fever, chills, fatigue, sinus pain, rhinorrhea, sore throat, SOB, wheezing, chest pain, nausea, changes in bowel or bladder habits.     ROS: As per HPI.  All other pertinent ROS negative.     Past Medical History:  Diagnosis Date  . Anemia   . Anxiety   . Chronic hypertension   . Depression   . Eclampsia (toxemia of pregnancy)   . History of eating disorder   . Incontinence of urine    due to nerve and muscle damage during child birth   Past Surgical History:  Procedure Laterality Date  . GALLBLADDER SURGERY    . MOUTH SURGERY     dental  . WISDOM TOOTH EXTRACTION     Allergies  Allergen Reactions  . Proanthocyanidin Anaphylaxis    Including grape juice, extracts, etc., reaction, throat gets scratchy, Pt gets short of breath, breaks out in rash also.   No current facility-administered medications on file prior to encounter.   Current Outpatient Medications on File Prior to Encounter  Medication Sig Dispense Refill  . acetaminophen (TYLENOL) 500 MG tablet Take 1,000 mg by mouth every 6 (six) hours as needed.    . blood glucose meter kit and supplies Dispense based on patient and insurance preference. Check blood sugar four times daily as directed. (FOR ICD-10 E10.9, E11.9). 1 each 0  . ibuprofen (ADVIL) 600 MG tablet Take 1 tablet (600 mg total) by mouth every 8 (eight) hours as needed for mild pain. 30 tablet 0  .  NIFEdipine (ADALAT CC) 30 MG 24 hr tablet Take 1 tablet (30 mg total) by mouth daily. 30 tablet 2  . prenatal vitamin w/FE, FA (PRENATAL 1 + 1) 27-1 MG TABS tablet Take 1 tablet by mouth daily at 12 noon. 30 each 11   Social History   Socioeconomic History  . Marital status: Married    Spouse name: Quillian Quince  . Number of children: 1  . Years of education: Not on file  . Highest education level: Not on file  Occupational History  . Not on file  Tobacco Use  . Smoking status: Former Smoker    Types: E-cigarettes    Quit date: 03/21/2016    Years since quitting: 4.6  . Smokeless tobacco: Never Used  Vaping Use  . Vaping Use: Former  Substance and Sexual Activity  . Alcohol use: Not Currently  . Drug use: Never  . Sexual activity: Yes    Birth control/protection: None  Other Topics Concern  . Not on file  Social History Narrative  . Not on file   Social Determinants of Health   Financial Resource Strain: Not on file  Food Insecurity: Not on file  Transportation Needs: Not on file  Physical Activity: Not on file  Stress: Not on file  Social Connections: Not on file  Intimate Partner Violence: Not on file   Family History  Problem Relation Age  of Onset  . Glaucoma Paternal Grandfather   . Heart attack Paternal Grandfather   . Glaucoma Paternal Grandmother   . Heart attack Maternal Grandmother   . Heart disease Maternal Grandmother   . Diabetes Maternal Grandmother   . Heart attack Maternal Grandfather   . Heart disease Maternal Grandfather   . Carpal tunnel syndrome Mother   . Fibromyalgia Mother   . Heart disease Mother   . ADD / ADHD Daughter   . Cancer Maternal Aunt   . Heart attack Maternal Uncle     OBJECTIVE:  Vitals:   11/01/20 1040  BP: 134/87  Pulse: 90  Resp: 16  Temp: 98.9 F (37.2 C)  SpO2: 99%     General appearance: alert; appears fatigued, but nontoxic; speaking in full sentences and tolerating own secretions HEENT: NCAT; Ears: EACs clear,  TMs pearly gray; Eyes: PERRL.  EOM grossly intact. Sinuses: nontender; Nose: nares patent without rhinorrhea, Throat: oropharynx clear, tonsils non erythematous or enlarged, uvula midline  Neck: supple without LAD Lungs: unlabored respirations, symmetrical air entry; cough: moderate; no respiratory distress; CTAB Heart: regular rate and rhythm.  Radial pulses 2+ symmetrical bilaterally Skin: warm and dry Psychological: alert and cooperative; normal mood and affect  LABS:  No results found for this or any previous visit (from the past 24 hour(s)).   ASSESSMENT & PLAN:  1. URI with cough and congestion   2. RSV exposure     Meds ordered this encounter  Medications  . benzonatate (TESSALON) 100 MG capsule    Sig: Take 1 capsule (100 mg total) by mouth every 8 (eight) hours.    Dispense:  30 capsule    Refill:  0  . fluticasone (FLONASE) 50 MCG/ACT nasal spray    Sig: Place 1 spray into both nostrils daily for 14 days.    Dispense:  16 g    Refill:  0  . cetirizine (ZYRTEC ALLERGY) 10 MG tablet    Sig: Take 1 tablet (10 mg total) by mouth daily.    Dispense:  30 tablet    Refill:  0  . albuterol (VENTOLIN HFA) 108 (90 Base) MCG/ACT inhaler    Sig: Inhale 1-2 puffs into the lungs every 6 (six) hours as needed for wheezing or shortness of breath.    Dispense:  18 g    Refill:  0   Discharge instructions  Get plenty of rest and push fluids Tessalon Perles prescribed for cough Zyrtec for nasal congestion, runny nose, and/or sore throat Flonase for nasal congestion and runny nose ProAir air was prescribed/take as directed Use medications daily for symptom relief Use OTC medications like ibuprofen or tylenol as needed fever or pain Call or go to the ED if you have any new or worsening symptoms such as fever, worsening cough, shortness of breath, chest tightness, chest pain, turning blue, changes in mental status, etc...   Reviewed expectations re: course of current medical  issues. Questions answered. Outlined signs and symptoms indicating need for more acute intervention. Patient verbalized understanding. After Visit Summary given.         Emerson Monte, FNP 11/01/20 1132    Emerson Monte, FNP 11/01/20 1133

## 2023-01-05 ENCOUNTER — Ambulatory Visit
Admission: EM | Admit: 2023-01-05 | Discharge: 2023-01-05 | Disposition: A | Discharge status: Home / Self Care | Payer: BC Managed Care – PPO

## 2023-01-05 DIAGNOSIS — A084 Viral intestinal infection, unspecified: Secondary | ICD-10-CM

## 2023-01-05 LAB — POCT INFLUENZA A/B
Influenza A, POC: NEGATIVE
Influenza B, POC: NEGATIVE

## 2023-01-05 MED ORDER — ONDANSETRON 4 MG PO TBDP: 4.0000 mg | ORAL_TABLET | Freq: Three times a day (TID) | ORAL | 0 refills | Status: AC | PRN

## 2023-01-05 MED ORDER — ONDANSETRON HCL 4 MG/2ML IJ SOLN
4.0000 mg | Freq: Once | INTRAMUSCULAR | Status: AC
  Administered 2023-01-05: 4 mg via INTRAVENOUS

## 2023-01-05 MED ORDER — SODIUM CHLORIDE 0.9 % IV BOLUS
1000.0000 mL | Freq: Once | INTRAVENOUS | Status: AC
  Administered 2023-01-05: 1000 mL via INTRAVENOUS

## 2023-01-05 NOTE — ED Provider Notes (Signed)
[facility]    CSN: [id number] Arrival date & time: 01/05/23  1202      History   Chief Complaint Chief Complaint  Patient presents with   Nausea   Emesis    HPI Mary Hardy is a 42 y.o. female.   Patient presents today for 1 day history of multiple episodes of nonbloody diarrhea, multiple episodes of vomiting this morning, and abdominal pain prior to the diarrhea begins.  Also reports body aches and chills.  Appetite has been decreasing today.  No loss of taste or smell.  Reports she has been able to tolerate sips of fluids, however not a lot of fluid.  Reports her daughter recently was sick with similar symptoms, however woke up feeling well this morning.  No recent antibiotic use.  Has not taken thing for symptoms so far.  Does have some dizziness with standing for long periods.  Has a history of syncope secondary to gastritis.      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    Patient Active Problem List   Diagnosis Date Noted   HTN in pregnancy, chronic 07/16/2019   Pre-eclampsia 07/16/2019   Gestational diabetes 07/16/2019   Proteinuria in pregnancy, antepartum 07/13/2019   Transient hypertension of pregnancy 07/13/2019   Diabetes mellitus ([place]) 05/18/2019   Chronic hypertension affecting pregnancy 01/25/2019   Depression with anxiety 01/04/2019   Marijuana use 01/04/2019   H/O eclampsia, then readmit for pp pre-e 01/03/2019   SUI (stress urinary incontinence, female) 01/03/2019   History of preterm delivery 12/09/2018    Past Surgical History:  Procedure Laterality Date   GALLBLADDER SURGERY     MOUTH SURGERY     dental   WISDOM TOOTH EXTRACTION      OB History     Gravida  4   Para  1   Term      Preterm  1   AB  2   Living  1       SAB  2   IAB      Ectopic      Multiple      Live Births  1            Home Medications    Prior to Admission medications   Medication Sig Start Date End Date Taking? Authorizing Provider  etonogestrel (NEXPLANON) 68 MG IMPL implant 1 each by Subdermal route once.   Yes [provider]  ondansetron (ZOFRAN-ODT) 4 MG disintegrating tablet Take 1 tablet (4 mg total) by mouth every 8 (eight) hours as needed for nausea or vomiting. 01/05/23  Yes Eulogio Bear, NP  sertraline (ZOLOFT) 50 MG tablet Take by mouth. 12/24/22  Yes [provider]  acetaminophen (TYLENOL) 500 MG tablet Take 1,000 mg by mouth every 6 (six) hours as needed.    [provider]  albuterol (VENTOLIN HFA) 108 (90 Base) MCG/ACT inhaler Inhale 1-2 puffs into the lungs every 6 (six) hours as needed for wheezing or shortness of breath. 11/01/20   Avegno, Darrelyn Hillock, FNP  benzonatate (TESSALON) 100 MG capsule Take 1 capsule (100 mg total) by mouth every 8 (eight) hours. 11/01/20   Avegno, Darrelyn Hillock, FNP  blood glucose meter  kit and supplies Dispense based on patient and insurance preference. Check blood sugar four times daily as directed. (FOR ICD-10 E10.9, E11.9). 05/26/19   Cresenzo-Dishmon, Joaquim Lai, CNM  cetirizine (ZYRTEC ALLERGY) 10 MG tablet Take 1 tablet (10 mg total) by mouth daily. 11/01/20   Avegno, Darrelyn Hillock, FNP  fluticasone (FLONASE) 50 MCG/ACT nasal spray Place 1 spray into both nostrils daily for 14 days. 11/01/20 11/15/20  Avegno, Darrelyn Hillock, FNP  ibuprofen (ADVIL) 600 MG tablet Take 1 tablet (600 mg total) by mouth every 8 (eight) hours as needed for mild pain. 07/18/19   Leftwich-Kirby, Kathie Dike, CNM  NIFEdipine (ADALAT CC) 30 MG 24 hr tablet Take 1 tablet (30 mg total) by mouth daily. 07/18/19   Leftwich-Kirby, Kathie Dike, CNM  prenatal vitamin w/FE, FA (PRENATAL 1 + 1) 27-1 MG TABS tablet Take 1 tablet by mouth daily at 12 noon. 01/10/19   Roma Schanz, CNM    Family  History Family History  Problem Relation Age of Onset   Glaucoma Paternal Grandfather    Heart attack Paternal Grandfather    Glaucoma Paternal Grandmother    Heart attack Maternal Grandmother    Heart disease Maternal Grandmother    Diabetes Maternal Grandmother    Heart attack Maternal Grandfather    Heart disease Maternal Grandfather    Carpal tunnel syndrome Mother    Fibromyalgia Mother    Heart disease Mother    ADD / ADHD Daughter    Cancer Maternal Aunt    Heart attack Maternal Uncle     Social History Social History   Tobacco Use   Smoking status: Former    Types: E-cigarettes    Quit date: 03/21/2016    Years since quitting: 6.7   Smokeless tobacco: Never  Vaping Use   Vaping Use: Former  Substance Use Topics   Alcohol use: Not Currently   Drug use: Never     Allergies   Proanthocyanidin   Review of Systems Review of Systems Per HPI  Physical Exam Triage Vital Signs ED Triage Vitals  Enc Vitals Group     BP 01/05/23 1321 (!) 131/90     Pulse Rate 01/05/23 1321 (!) 132     Resp 01/05/23 1321 18     Temp 01/05/23 1321 100.1 F (37.8 C)     Temp Source 01/05/23 1321 Oral     SpO2 01/05/23 1321 98 %     Weight --      Height --      Head Circumference --      Peak Flow --      Pain Score 01/05/23 1324 0     Pain Loc --      Pain Edu? --      Excl. in [place]? --    Orthostatic VS for the past 24 hrs:  BP- Lying Pulse- Lying BP- Sitting Pulse- Sitting BP- Standing at 0 minutes Pulse- Standing at 0 minutes  01/05/23 1504 124/80 103 119/83 110 124/82 118  01/05/23 1336 128/85 118 120/81 125 96/63 137    Updated Vital Signs BP (!) 131/90 (BP Location: Right Arm)   Pulse (!) 132   Temp 100.1 F (37.8 C) (Oral)   Resp 18   SpO2 98%   Breastfeeding No   Visual Acuity Right Eye Distance:   Left Eye Distance:   Bilateral Distance:    Right Eye Near:   Left Eye Near:    Bilateral Near:     Physical Exam Vitals  and nursing note reviewed.   Constitutional:      General: She is not in acute distress.    Appearance: Normal appearance. She is not toxic-appearing.  HENT:     Head: Normocephalic and atraumatic.     Mouth/Throat:     Mouth: Mucous membranes are moist.     Pharynx: Oropharynx is clear.  Eyes:     General: No scleral icterus.    Extraocular Movements: Extraocular movements intact.  Cardiovascular:     Rate and Rhythm: Regular rhythm. Tachycardia present.  Pulmonary:     Effort: Pulmonary effort is normal. No respiratory distress.     Breath sounds: Normal breath sounds. No wheezing, rhonchi or rales.  Abdominal:     General: Abdomen is flat. Bowel sounds are normal. There is no distension.     Palpations: Abdomen is soft.     Tenderness: There is no abdominal tenderness. There is no guarding.  Musculoskeletal:     Cervical back: Normal range of motion.  Lymphadenopathy:     Cervical: No cervical adenopathy.  Skin:    General: Skin is warm and dry.     Capillary Refill: Capillary refill takes less than 2 seconds.     Coloration: Skin is not jaundiced or pale.     Findings: No erythema.  Neurological:     Mental Status: She is alert and oriented to person, place, and time.  Psychiatric:        Mood and Affect: Mood is not anxious.        Behavior: Behavior is cooperative.      UC Treatments / Results  Labs (all labs ordered are listed, but only abnormal results are displayed) Labs Reviewed  POCT INFLUENZA A/B    EKG   Radiology No results found.  Procedures Procedures (including critical care time)  Medications Ordered in UC Medications  ondansetron (ZOFRAN) injection 4 mg (4 mg Intravenous Given 01/05/23 1407)  sodium chloride 0.9 % bolus 1,000 mL (0 mLs Intravenous Stopped 01/05/23 1500)    Initial Impression / Assessment and Plan / UC Course  I have reviewed the triage vital signs and the nursing notes.  Pertinent labs & imaging results that were available during my care of the  patient were reviewed by me and considered in my medical decision making (see chart for details).   Patient is well-appearing, normotensive, not tachypneic, oxygenating well on room air.  She has a low-grade fever and is tachycardic in triage.  Initially, orthostatic vital signs reveal orthostatic static hypotension.  This was treated with a 1000 mL bolus of normal saline.  After bolus, hypotension improved as well as the tachycardia.  See vital signs above.  1. Viral gastroenteritis Suspect viral etiology Flu a and B test are negative today Patient was treated with Zofran IV today in urgent care, 1 L of normal saline Patient was able to tolerate oral fluids after treatment Vital signs improved after treatment and patient was feeling better Will discharge home with prescription for Zofran to use every 8 hours as needed Strict ER precautions discussed with patient Note given for work  The patient was given the opportunity to ask questions.  All questions answered to their satisfaction.  The patient is in agreement to this plan.    Final Clinical Impressions(s) / UC Diagnoses   Final diagnoses:  Viral gastroenteritis     Discharge Instructions      I suspect you have a stomach bug.  We have given you 1  liter of fluid to help rehydrate you.  We have also given you Zofran today around 2 pm. Continue to use the Zofran every 8 hours as you need it.  Make sure you are drinking plenty of fluids to prevent dehydration.  If you continue to vomit despite the medicine or start seeing blood in your stool, call 911 or go to the ER.     ED Prescriptions     Medication Sig Dispense Auth. Provider   ondansetron (ZOFRAN-ODT) 4 MG disintegrating tablet Take 1 tablet (4 mg total) by mouth every 8 (eight) hours as needed for nausea or vomiting. 20 tablet Eulogio Bear, NP      PDMP not reviewed this encounter.   Eulogio Bear, NP 01/05/23 [phone]

## 2023-01-05 NOTE — Discharge Instructions (Signed)
I suspect you have a stomach bug.  We have given you 1 liter of fluid to help rehydrate you.  We have also given you Zofran today around 2 pm. Continue to use the Zofran every 8 hours as you need it.  Make sure you are drinking plenty of fluids to prevent dehydration.  If you continue to vomit despite the medicine or start seeing blood in your stool, call 911 or go to the ER.

## 2023-01-05 NOTE — ED Triage Notes (Signed)
Pt reports chills, body aches, fatigue nausea and vomiting since this morning.

## 2024-06-03 ENCOUNTER — Emergency Department (HOSPITAL_COMMUNITY)
Admission: EM | Admit: 2024-06-03 | Discharge: 2024-06-03 | Disposition: A | Discharge status: Home / Self Care | Attending: Emergency Medicine | Admitting: Emergency Medicine

## 2024-06-03 ENCOUNTER — Emergency Department (HOSPITAL_COMMUNITY)

## 2024-06-03 ENCOUNTER — Other Ambulatory Visit: Payer: Self-pay

## 2024-06-03 ENCOUNTER — Encounter (HOSPITAL_COMMUNITY): Payer: Self-pay | Admitting: Emergency Medicine

## 2024-06-03 DIAGNOSIS — N201 Calculus of ureter: Secondary | ICD-10-CM

## 2024-06-03 DIAGNOSIS — M546 Pain in thoracic spine: Secondary | ICD-10-CM | POA: Diagnosis present

## 2024-06-03 DIAGNOSIS — N132 Hydronephrosis with renal and ureteral calculous obstruction: Secondary | ICD-10-CM | POA: Diagnosis not present

## 2024-06-03 DIAGNOSIS — N179 Acute kidney failure, unspecified: Secondary | ICD-10-CM | POA: Insufficient documentation

## 2024-06-03 LAB — COMPREHENSIVE METABOLIC PANEL WITH GFR
ALT: 20 U/L (ref 0–44)
AST: 16 U/L (ref 15–41)
Albumin: 3.9 g/dL (ref 3.5–5.0)
Alkaline Phosphatase: 68 U/L (ref 38–126)
Anion gap: 14 (ref 5–15)
BUN: 14 mg/dL (ref 6–20)
CO2: 20 mmol/L — ABNORMAL LOW (ref 22–32)
Calcium: 8.9 mg/dL (ref 8.9–10.3)
Chloride: 104 mmol/L (ref 98–111)
Creatinine, Ser: 1.43 mg/dL — ABNORMAL HIGH (ref 0.44–1.00)
GFR, Estimated: 48 mL/min — ABNORMAL LOW (ref >60)
Glucose, Bld: 126 mg/dL — ABNORMAL HIGH (ref 70–99)
Potassium: 4.1 mmol/L (ref 3.5–5.1)
Sodium: 138 mmol/L (ref 135–145)
Total Bilirubin: 0.5 mg/dL (ref 0.0–1.2)
Total Protein: 7.4 g/dL (ref 6.5–8.1)

## 2024-06-03 LAB — URINALYSIS, ROUTINE W REFLEX MICROSCOPIC
Bilirubin Urine: NEGATIVE
Glucose, UA: NEGATIVE mg/dL
Hgb urine dipstick: NEGATIVE
Ketones, ur: NEGATIVE mg/dL
Nitrite: NEGATIVE
Protein, ur: 30 mg/dL — AB
Specific Gravity, Urine: 1.026 (ref 1.005–1.030)
pH: 5 (ref 5.0–8.0)

## 2024-06-03 LAB — CBC WITH DIFFERENTIAL/PLATELET
Abs Immature Granulocytes: 0.07 K/uL (ref 0.00–0.07)
Basophils Absolute: 0.1 K/uL (ref 0.0–0.1)
Basophils Relative: 1 %
Eosinophils Absolute: 0.1 K/uL (ref 0.0–0.5)
Eosinophils Relative: 1 %
HCT: 42.9 % (ref 36.0–46.0)
Hemoglobin: 14.6 g/dL (ref 12.0–15.0)
Immature Granulocytes: 1 %
Lymphocytes Relative: 11 %
Lymphs Abs: 1.3 K/uL (ref 0.7–4.0)
MCH: 29.7 pg (ref 26.0–34.0)
MCHC: 34 g/dL (ref 30.0–36.0)
MCV: 87.2 fL (ref 80.0–100.0)
Monocytes Absolute: 0.6 K/uL (ref 0.1–1.0)
Monocytes Relative: 5 %
Neutro Abs: 9.7 K/uL — ABNORMAL HIGH (ref 1.7–7.7)
Neutrophils Relative %: 81 %
Platelets: 277 K/uL (ref 150–400)
RBC: 4.92 MIL/uL (ref 3.87–5.11)
RDW: 11.9 % (ref 11.5–15.5)
WBC: 11.9 K/uL — ABNORMAL HIGH (ref 4.0–10.5)
nRBC: 0 % (ref 0.0–0.2)

## 2024-06-03 LAB — PREGNANCY, URINE: Preg Test, Ur: NEGATIVE

## 2024-06-03 LAB — LIPASE, BLOOD: Lipase: 32 U/L (ref 11–51)

## 2024-06-03 MED ORDER — LACTATED RINGERS IV BOLUS: 1000.0000 mL | Freq: Once | INTRAVENOUS | Status: DC

## 2024-06-03 MED ORDER — HYDROMORPHONE HCL 1 MG/ML IJ SOLN
1.0000 mg | Freq: Once | INTRAMUSCULAR | Status: AC
  Administered 2024-06-03: 1 mg via INTRAVENOUS
  Filled 2024-06-03: qty 1

## 2024-06-03 MED ORDER — KETOROLAC TROMETHAMINE 15 MG/ML IJ SOLN
15.0000 mg | Freq: Once | INTRAMUSCULAR | Status: DC
  Filled 2024-06-03: qty 1

## 2024-06-03 MED ORDER — ACETAMINOPHEN 500 MG PO TABS
1000.0000 mg | ORAL_TABLET | Freq: Once | ORAL | Status: AC
  Administered 2024-06-03: 1000 mg via ORAL
  Filled 2024-06-03: qty 2

## 2024-06-03 MED ORDER — ONDANSETRON HCL 4 MG/2ML IJ SOLN
4.0000 mg | Freq: Once | INTRAMUSCULAR | Status: AC
  Administered 2024-06-03: 4 mg via INTRAVENOUS
  Filled 2024-06-03: qty 2

## 2024-06-03 MED ORDER — LIDOCAINE 5 % EX PTCH
1.0000 | MEDICATED_PATCH | CUTANEOUS | Status: DC
  Administered 2024-06-03: 1 via TRANSDERMAL
  Filled 2024-06-03: qty 1

## 2024-06-03 MED ORDER — ACETAMINOPHEN 500 MG PO TABS: 1000.0000 mg | ORAL_TABLET | Freq: Three times a day (TID) | ORAL | 0 refills | Status: AC | PRN

## 2024-06-03 MED ORDER — LACTATED RINGERS IV BOLUS
1000.0000 mL | Freq: Once | INTRAVENOUS | Status: AC
  Administered 2024-06-03: 1000 mL via INTRAVENOUS

## 2024-06-03 MED ORDER — NAPROXEN 375 MG PO TABS: 375.0000 mg | ORAL_TABLET | Freq: Two times a day (BID) | ORAL | 0 refills | Status: AC

## 2024-06-03 MED ORDER — ONDANSETRON 4 MG PO TBDP: 4.0000 mg | ORAL_TABLET | Freq: Four times a day (QID) | ORAL | 0 refills | Status: AC | PRN

## 2024-06-03 MED ORDER — KETOROLAC TROMETHAMINE 15 MG/ML IJ SOLN
15.0000 mg | Freq: Once | INTRAMUSCULAR | Status: AC
  Administered 2024-06-03: 15 mg via INTRAVENOUS
  Filled 2024-06-03: qty 1

## 2024-06-03 MED ORDER — HYDROCODONE-ACETAMINOPHEN 5-325 MG PO TABS
1.0000 | ORAL_TABLET | Freq: Once | ORAL | Status: AC
  Administered 2024-06-03: 1 via ORAL
  Filled 2024-06-03: qty 1

## 2024-06-03 MED ORDER — TAMSULOSIN HCL 0.4 MG PO CAPS: 0.4000 mg | ORAL_CAPSULE | Freq: Every day | ORAL | 0 refills | Status: DC

## 2024-06-03 MED ORDER — MORPHINE SULFATE (PF) 4 MG/ML IV SOLN
4.0000 mg | Freq: Once | INTRAVENOUS | Status: AC
  Administered 2024-06-03: 4 mg via INTRAVENOUS
  Filled 2024-06-03: qty 1

## 2024-06-03 MED ORDER — OXYCODONE HCL 5 MG PO TABS: 5.0000 mg | ORAL_TABLET | Freq: Four times a day (QID) | ORAL | 0 refills | Status: DC | PRN

## 2024-06-03 NOTE — Discharge Instructions (Addendum)
 Is a pleasure taking care of you today.  You were evaluated in the ER for severe left upper back pain.  Your CT scan shows an 11 mm kidney stone as the cause of the pain.  We are giving a prescription pain medicine, including Tylenol , naproxen  and oxycodone .  Do not take any other over-the-counter medications with the use.  Your kidney function was slightly elevated.  Have this rechecked by PCP or the urologist.  Drink plenty of fluids.  Avoid taking excess amounts of over-the-counter NSAIDs such as ibuprofen , Aleve , aspirin.  Come back if you have new or worsening symptoms, especially fever, chills, burning with urination.  Follow-up with urologist within the next week, call Monday morning for appointment.

## 2024-06-03 NOTE — ED Triage Notes (Signed)
 Pt c/o mid back pain on right side radiating to front.

## 2024-06-03 NOTE — ED Provider Notes (Addendum)
 [place] EMERGENCY DEPARTMENT AT [facility] Provider Note   CSN: [id number] Arrival date & time: 06/03/24  9044     Patient presents with: Back Pain   Mary Hardy is a 43 y.o. female.  Past history of depression, anxiety, preeclampsia.  Presents ER for right mid back pain ongoing for past several days, she been taking ibuprofen  and Bayer aspirin, using massage and heat with mild relief states the pain at times is very intense, feels like a very severe cramp but does get moments of reprieve.  She denies dysuria or hematuria.  No nausea or vomiting.  Because the pain has been unrelenting she is worried about possible kidney stone.  She denies LOC or paresthesia, no bowel or bladder incontinence outside of her usual stress incontinence.  No extremity weakness.  She denies injury or trauma to the area.  She denies fevers, chills, unintentional weight loss or IV drug abuse    Back Pain      Prior to Admission medications   Medication Sig Start Date End Date Taking? Authorizing Provider  acetaminophen  (TYLENOL ) 500 MG tablet Take 2 tablets (1,000 mg total) by mouth every 8 (eight) hours as needed for moderate pain (pain score 4-6). 06/03/24  Yes [name], PA-C  naproxen  (NAPROSYN ) 375 MG tablet Take 1 tablet (375 mg total) by mouth 2 (two) times daily. 06/03/24  Yes [name], PA-C  ondansetron  (ZOFRAN -ODT) 4 MG disintegrating tablet Take 1 tablet (4 mg total) by mouth every 6 (six) hours as needed for nausea or vomiting. 06/03/24  Yes [name], [name], PA-C  oxyCODONE  (ROXICODONE ) 5 MG immediate release tablet Take 1 tablet (5 mg total) by mouth every 6 (six) hours as needed for severe pain (pain score 7-10). 06/03/24  Yes [name], PA-C  tamsulosin  (FLOMAX ) 0.4 MG CAPS capsule Take 1 capsule (0.4 mg total) by mouth daily after breakfast. 06/03/24  Yes Annalicia Renfrew A, PA-C  acetaminophen  (TYLENOL ) 500 MG tablet Take 1,000 mg by mouth every 6 (six) hours  as needed.    [provider]  albuterol  (VENTOLIN  HFA) 108 (90 Base) MCG/ACT inhaler Inhale 1-2 puffs into the lungs every 6 (six) hours as needed for wheezing or shortness of breath. 11/01/20   Avegno, Komlanvi S, FNP  benzonatate  (TESSALON ) 100 MG capsule Take 1 capsule (100 mg total) by mouth every 8 (eight) hours. 11/01/20   Avegno, Komlanvi S, FNP  blood glucose meter kit and supplies Dispense based on patient and insurance preference. Check blood sugar four times daily as directed. (FOR ICD-10 E10.9, E11.9). 05/26/19   Cresenzo-Dishmon, Cathlean, CNM  cetirizine  (ZYRTEC  ALLERGY) 10 MG tablet Take 1 tablet (10 mg total) by mouth daily. 11/01/20   Avegno, Komlanvi S, FNP  etonogestrel  (NEXPLANON ) 68 MG IMPL implant 1 each by Subdermal route once.    [provider]  fluticasone  (FLONASE ) 50 MCG/ACT nasal spray Place 1 spray into both nostrils daily for 14 days. 11/01/20 11/15/20  Avegno, Komlanvi S, FNP  ibuprofen  (ADVIL ) 600 MG tablet Take 1 tablet (600 mg total) by mouth every 8 (eight) hours as needed for mild pain. 07/18/19   Leftwich-Kirby, Olam LABOR, CNM  NIFEdipine  (ADALAT  CC) 30 MG 24 hr tablet Take 1 tablet (30 mg total) by mouth daily. 07/18/19   Leftwich-Kirby, Olam LABOR, CNM  ondansetron  (ZOFRAN -ODT) 4 MG disintegrating tablet Take 1 tablet (4 mg total) by mouth every 8 (eight) hours as needed for nausea or vomiting. 01/05/23   Chandra,  Harlene LABOR, NP  prenatal vitamin w/FE, FA (PRENATAL 1 + 1) 27-1 MG TABS tablet Take 1 tablet by mouth daily at 12 noon. 01/10/19   Kizzie Suzen SAUNDERS, CNM  sertraline (ZOLOFT) 50 MG tablet Take by mouth. 12/24/22   [provider]    Allergies: Proanthocyanidin    Review of Systems  Musculoskeletal:  Positive for back pain.    Updated Vital Signs BP (!) 128/93 (BP Location: Right Arm)   Pulse 82   Temp 97.9 F (36.6 C) (Oral)   Resp 16   Ht 5' 2 (1.575 m)   Wt 81.6 kg   SpO2 99%   BMI 32.92 kg/m   Physical Exam Vitals and  nursing note reviewed.  Constitutional:      General: She is not in acute distress.    Appearance: She is well-developed.     Comments: Patient patient appears to be uncomfortable  HENT:     Head: Normocephalic and atraumatic.  Eyes:     Extraocular Movements: Extraocular movements intact.     Conjunctiva/sclera: Conjunctivae normal.     Pupils: Pupils are equal, round, and reactive to light.  Cardiovascular:     Rate and Rhythm: Normal rate and regular rhythm.     Heart sounds: No murmur heard. Pulmonary:     Effort: Pulmonary effort is normal. No respiratory distress.     Breath sounds: Normal breath sounds.  Abdominal:     Palpations: Abdomen is soft.     Tenderness: There is no abdominal tenderness. There is no right CVA tenderness, left CVA tenderness, guarding or rebound.  Musculoskeletal:        General: No swelling.     Cervical back: Neck supple.  Skin:    General: Skin is warm and dry.     Capillary Refill: Capillary refill takes less than 2 seconds.  Neurological:     General: No focal deficit present.     Mental Status: She is alert and oriented to person, place, and time.  Psychiatric:        Mood and Affect: Mood normal.     (all labs ordered are listed, but only abnormal results are displayed) Labs Reviewed  CBC WITH DIFFERENTIAL/PLATELET - Abnormal; Notable for the following components:      Result Value   WBC 11.9 (*)    Neutro Abs 9.7 (*)    All other components within normal limits  COMPREHENSIVE METABOLIC PANEL WITH GFR - Abnormal; Notable for the following components:   CO2 20 (*)    Glucose, Bld 126 (*)    Creatinine, Ser 1.43 (*)    GFR, Estimated 48 (*)    All other components within normal limits  URINALYSIS, ROUTINE W REFLEX MICROSCOPIC - Abnormal; Notable for the following components:   Protein, ur 30 (*)    Leukocytes,Ua MODERATE (*)    Bacteria, UA RARE (*)    All other components within normal limits  URINE CULTURE  LIPASE, BLOOD   PREGNANCY, URINE    EKG: None  Radiology: CT Renal Stone Study Result Date: 06/03/2024 CLINICAL DATA:  Right flank pain. EXAM: CT ABDOMEN AND PELVIS WITHOUT CONTRAST TECHNIQUE: Multidetector CT imaging of the abdomen and pelvis was performed following the standard protocol without IV contrast. RADIATION DOSE REDUCTION: This exam was performed according to the departmental dose-optimization program which includes automated exposure control, adjustment of the mA and/or kV according to patient size and/or use of iterative reconstruction technique. COMPARISON:  None Available. FINDINGS: Lower  chest: No acute abnormality. Hepatobiliary: No focal liver abnormality is seen. Status post cholecystectomy. No biliary dilatation. Pancreas: Unremarkable. No pancreatic ductal dilatation or surrounding inflammatory changes. Spleen: Normal in size without focal abnormality. Adrenals/Urinary Tract: Adrenal glands appear normal. Bilateral nephrolithiasis is noted. Moderate right hydronephrosis is noted secondary to 11 mm calculus in proximal right ureter just beyond ureteropelvic junction. Urinary bladder is unremarkable. Stomach/Bowel: Stomach is within normal limits. Appendix appears normal. No evidence of bowel wall thickening, distention, or inflammatory changes. Vascular/Lymphatic: No significant vascular findings are present. No enlarged abdominal or pelvic lymph nodes. Reproductive: Uterus and bilateral adnexa are unremarkable. Other: No ascites or hernia. Musculoskeletal: No acute or significant osseous findings. IMPRESSION: Bilateral nephrolithiasis. Moderate right hydronephrosis is noted secondary to 11 mm calculus in proximal right ureter just beyond ureteropelvic junction. Electronically Signed   By: Lynwood Landy Raddle M.D.   On: 06/03/2024 12:54     Procedures   Medications Ordered in the ED  lidocaine  (LIDODERM ) 5 % 1 patch (1 patch Transdermal Patch Applied 06/03/24 1141)  HYDROcodone -acetaminophen   (NORCO/VICODIN) 5-325 MG per tablet 1 tablet (1 tablet Oral Given 06/03/24 1141)  morphine  (PF) 4 MG/ML injection 4 mg (4 mg Intravenous Given 06/03/24 1338)  ondansetron  (ZOFRAN ) injection 4 mg (4 mg Intravenous Given 06/03/24 1338)  lactated ringers  bolus 1,000 mL (1,000 mLs Intravenous New Bag/Given 06/03/24 1338)  ketorolac  (TORADOL ) 15 MG/ML injection 15 mg (15 mg Intravenous Given 06/03/24 1431)  HYDROmorphone  (DILAUDID ) injection 1 mg (1 mg Intravenous Given 06/03/24 1431)  acetaminophen  (TYLENOL ) tablet 1,000 mg (1,000 mg Oral Given 06/03/24 1617)                                    Medical Decision Making This patient presents to the ED for concern of right flank pain x 3 days that has been constant, this involves an extensive number of treatment options, and is a complaint that carries with it a high risk of complications and morbidity.  The differential diagnosis includes muscle strain, zoster, ureterolithiasis, other   Co morbidities that complicate the patient evaluation :   Stress incontinence, anxiety, depression   Additional history obtained:  Additional history obtained from EMR External records from outside source obtained and reviewed including ER notes, labs   Lab Tests:  I Ordered, and personally interpreted labs.  The pertinent results include: Patient noted to have AKI with creatinine of 1.43 with baseline of less than 1   Imaging Studies ordered:  I ordered imaging studies including CT abdomen pelvis without contrast which shows 11 mm right ureteral stone in the proximal ureter with moderate hydronephrosis I independently visualized and interpreted imaging within scope of identifying emergent findings  I agree with the radiologist interpretation    Consultations Obtained:  I requested consultation with the urologist Dr. Sherrilee,  and discussed lab and imaging findings as well as pertinent plan - they recommend: Pain control, follow-up within a week in the  office   Problem List / ED Course / Critical interventions / Medication management   2 days of constant right flank pain that has been mostly intractable despite use of ibuprofen  and aspirin.  CT abdomen pelvis shows an 11 mm right proximal ureteral stone with moderate hydronephrosis.  Labs are significant for AKI with creatinine at 1.43, patient given IV fluids, given Norco without any relief, given morphine  with relief for about 15 minutes then pain returned at  10 out of 10, I had discussed with Dr. Sherrilee from urology who advised giving IV Toradol  and Dilaudid  and calling back after these are given.  Patient pain was controlled with Toradol  and Dilaudid , she was rechecked multiple times, we discussed discharge, follow-up with urology.  She is agreeable with plan of care and discharge.  She is given instructions to come back if she develops fever, chills, dizziness or UTI symptoms such as burning or frequency.  Will discharge her with a urine strainer and Flomax  as well.  Urine sample today contaminated she is not having UTI symptoms, no need for treatment but will send culture as caution. I ordered medication including morphine , Toradol , Dilaudid  and hydrocodone  for pain Reevaluation of the patient after these medicines showed that the patient resolved I have reviewed the patients home medicines and have made adjustments as needed   Social Determinants of Health:  Lives with her husband    Amount and/or Complexity of Data Reviewed Labs: ordered. Radiology: ordered.  Risk OTC drugs. Prescription drug management.        Final diagnoses:  Ureterolithiasis  AKI (acute kidney injury) [facility])    ED Discharge Orders          Ordered    acetaminophen  (TYLENOL ) 500 MG tablet  Every 8 hours PRN        06/03/24 1626    oxyCODONE  (ROXICODONE ) 5 MG immediate release tablet  Every 6 hours PRN        06/03/24 1626    ondansetron  (ZOFRAN -ODT) 4 MG disintegrating tablet  Every 6  hours PRN        06/03/24 1626    naproxen  (NAPROSYN ) 375 MG tablet  2 times daily        06/03/24 1626    tamsulosin  (FLOMAX ) 0.4 MG CAPS capsule  Daily after breakfast        06/03/24 1627               [name] Sherran LABOR, PA-C 06/03/24 1631    [name] Sherran LABOR, PA-C 06/03/24 1632    Towana Ozell BROCKS, MD 06/03/24 1814

## 2024-06-05 LAB — URINE CULTURE

## 2024-06-06 NOTE — Progress Notes (Signed)
 Chief Complaint: No chief complaint on file.   History of Present Illness:  Mary Hardy is a 43 y.o. female who is seen in consultation from Arby Lyle LABOR, NP for evaluation of right sided ureteral stone.  She presented to the emergency room last Wednesday with sudden onset right flank pain, severe in nature.  Found to have a 10 mm right upper ureteral stone with moderate hydronephrosis.  Managed thus far with oxycodone , naproxen  and tamsulosin .  No prior history of kidney stones.  She denies any high fever.  Urinalysis at the emergency room revealed no evidence of infection.   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    Family History:  Family History  Problem Relation Age of Onset   Glaucoma Paternal Grandfather    Heart attack Paternal Grandfather    Glaucoma Paternal Grandmother    Heart attack Maternal Grandmother    Heart disease Maternal Grandmother    Diabetes Maternal Grandmother    Heart attack Maternal Grandfather    Heart disease Maternal Grandfather    Carpal tunnel syndrome Mother    Fibromyalgia Mother    Heart disease Mother    ADD / ADHD Daughter    Cancer Maternal Aunt    Heart attack Maternal Uncle     Social History:  Social History   Tobacco Use   Smoking status: Former    Types: E-cigarettes    Quit date: 03/21/2016    Years since quitting: 8.2    Smokeless tobacco: Never  Vaping Use   Vaping status: Former  Substance Use Topics   Alcohol use: Not Currently   Drug use: Never    Review of symptoms:  Constitutional:  Negative for unexplained weight loss, night sweats, fever, chills ENT:  Negative for nose bleeds, sinus pain, painful swallowing CV:  Negative for chest pain, shortness of breath, exercise intolerance, palpitations, loss of consciousness Resp:  Negative for cough, wheezing, shortness of breath GI:  Negative for nausea, vomiting, diarrhea, bloody stools GU:  Positives noted in HPI; otherwise negative for gross hematuria, dysuria, urinary incontinence Neuro:  Negative for seizures, poor balance, limb weakness, slurred speech Psych:  Negative for lack of energy, depression, anxiety Endocrine:  Negative for polydipsia, polyuria, symptoms of hypoglycemia (dizziness, hunger, sweating) Hematologic:  Negative for anemia, purpura, petechia, prolonged or excessive bleeding, use of anticoagulants  Allergic:  Negative for difficulty breathing or choking as a result of exposure to anything; no shellfish allergy; no allergic response (rash/itch) to materials, foods  Physical exam: There were no vitals taken for this visit. GENERAL APPEARANCE:  Well appearing, well developed, well nourished, NAD HEENT: Atraumatic, Normocephalic, oropharynx clear. NECK: Supple without lymphadenopathy or thyromegaly. LUNGS: Clear to auscultation bilaterally. HEART: Regular Rate and Rhythm without murmurs, gallops, or rubs. EXTREMITIES: Moves  all extremities well.  Without clubbing, cyanosis, or edema. NEUROLOGIC:  Alert and oriented x 3, normal gait, CN II-XII grossly intact.  MENTAL STATUS:  Appropriate. BACK:  Non-tender to palpation.  No CVAT SKIN:  Warm, dry and intact.    Results:   I have reviewed referring/prior physicians records-ER notes  I have reviewed urinalysis--no evidence of UTI  I have reviewed prior urine cultures  I  reviewed prior imaging studies-recent CT images reviewed.  Skin to stone distance 13 cm, Hounsfield units 1200  Assessment: Uncomplicated moderate size right upper ureteral stone   Plan: 1.  Oxycodone  and tamsulosin  will refill  2.  We will send her for KUB today  3.  I discussed eventual management of the stone as it most likely will not transit very quickly.  We talked about ureteroscopy as well as shockwave lithotripsy.  Risks and benefits of each discussed.  As this is fairly well seen on KUB, I think it worthwhile to send her for shockwave lithotripsy.  She is familiar with this as her father has had quite a few.  4.  I told her she can take naproxen  up to the beginning of this weekend.

## 2024-06-07 ENCOUNTER — Ambulatory Visit (HOSPITAL_COMMUNITY)
Admission: RE | Admit: 2024-06-07 | Discharge: 2024-06-07 | Disposition: A | Discharge status: Home / Self Care | Source: Ambulatory Visit | Attending: Urology | Admitting: Urology

## 2024-06-07 ENCOUNTER — Ambulatory Visit: Admitting: Urology

## 2024-06-07 VITALS — BP 148/99 | HR 150

## 2024-06-07 DIAGNOSIS — N1330 Unspecified hydronephrosis: Secondary | ICD-10-CM | POA: Diagnosis not present

## 2024-06-07 DIAGNOSIS — N200 Calculus of kidney: Secondary | ICD-10-CM

## 2024-06-07 DIAGNOSIS — N201 Calculus of ureter: Secondary | ICD-10-CM | POA: Diagnosis present

## 2024-06-07 MED ORDER — OXYCODONE HCL 5 MG PO TABS: 5.0000 mg | ORAL_TABLET | Freq: Four times a day (QID) | ORAL | 0 refills | Status: DC | PRN

## 2024-06-07 MED ORDER — TAMSULOSIN HCL 0.4 MG PO CAPS: 0.4000 mg | ORAL_CAPSULE | Freq: Every day | ORAL | 0 refills | Status: DC

## 2024-06-08 LAB — MICROSCOPIC EXAMINATION

## 2024-06-08 LAB — URINALYSIS, ROUTINE W REFLEX MICROSCOPIC
Bilirubin, UA: NEGATIVE
Glucose, UA: NEGATIVE
Nitrite, UA: NEGATIVE
RBC, UA: NEGATIVE
Specific Gravity, UA: 1.025 (ref 1.005–1.030)
Urobilinogen, Ur: 2 mg/dL — ABNORMAL HIGH (ref 0.2–1.0)
pH, UA: 6 (ref 5.0–7.5)

## 2024-06-09 ENCOUNTER — Other Ambulatory Visit: Payer: Self-pay

## 2024-06-09 DIAGNOSIS — N201 Calculus of ureter: Secondary | ICD-10-CM

## 2024-06-10 ENCOUNTER — Encounter: Payer: Self-pay | Admitting: Urology

## 2024-06-10 ENCOUNTER — Encounter (HOSPITAL_COMMUNITY): Admission: RE | Admit: 2024-06-10 | Source: Ambulatory Visit

## 2024-06-14 ENCOUNTER — Encounter (HOSPITAL_COMMUNITY): Payer: Self-pay | Admitting: Urology

## 2024-06-14 ENCOUNTER — Encounter (HOSPITAL_COMMUNITY)
Admission: RE | Disposition: A | Discharge status: Home / Self Care | Payer: Self-pay | Source: Home / Self Care | Attending: Urology

## 2024-06-14 ENCOUNTER — Ambulatory Visit (HOSPITAL_COMMUNITY)
Admission: RE | Admit: 2024-06-14 | Discharge: 2024-06-14 | Disposition: A | Discharge status: Home / Self Care | Attending: Urology | Admitting: Urology

## 2024-06-14 ENCOUNTER — Ambulatory Visit (HOSPITAL_COMMUNITY)

## 2024-06-14 DIAGNOSIS — Z01818 Encounter for other preprocedural examination: Secondary | ICD-10-CM

## 2024-06-14 DIAGNOSIS — N201 Calculus of ureter: Secondary | ICD-10-CM | POA: Diagnosis present

## 2024-06-14 DIAGNOSIS — I10 Essential (primary) hypertension: Secondary | ICD-10-CM | POA: Insufficient documentation

## 2024-06-14 HISTORY — PX: EXTRACORPOREAL SHOCK WAVE LITHOTRIPSY: SHX1557

## 2024-06-14 LAB — POCT PREGNANCY, URINE: Preg Test, Ur: NEGATIVE

## 2024-06-14 SURGERY — LITHOTRIPSY, ESWL
Anesthesia: LOCAL | Laterality: Right

## 2024-06-14 MED ORDER — OXYCODONE HCL 5 MG PO TABS: 5.0000 mg | ORAL_TABLET | Freq: Four times a day (QID) | ORAL | 0 refills | Status: DC | PRN

## 2024-06-14 MED ORDER — DIPHENHYDRAMINE HCL 25 MG PO CAPS
25.0000 mg | ORAL_CAPSULE | ORAL | Status: AC
  Administered 2024-06-14: 25 mg via ORAL
  Filled 2024-06-14: qty 1

## 2024-06-14 MED ORDER — DIAZEPAM 5 MG PO TABS
10.0000 mg | ORAL_TABLET | Freq: Once | ORAL | Status: AC
  Administered 2024-06-14: 10 mg via ORAL
  Filled 2024-06-14: qty 2

## 2024-06-14 MED ORDER — SODIUM CHLORIDE 0.9 % IV SOLN: INTRAVENOUS | Status: DC

## 2024-06-14 NOTE — Discharge Instructions (Signed)
 See [facility] discharge instructions in chart.

## 2024-06-14 NOTE — Brief Op Note (Signed)
See [facility] OP note scanned into chart. 

## 2024-06-14 NOTE — Progress Notes (Signed)
 Assessed pt right side post treatment.  Some redness noted.  Pt states just a little discomfort.

## 2024-06-14 NOTE — Interval H&P Note (Signed)
 History and Physical Interval Note:  06/14/2024 12:06 PM  Mary Hardy  has presented today for surgery, with the diagnosis of right ureteral stone.  The various methods of treatment have been discussed with the patient and family. After consideration of risks, benefits and other options for treatment, the patient has consented to  Procedure(s): LITHOTRIPSY, ESWL (Right) as a surgical intervention.  The patient's history has been reviewed, patient examined, no change in status, stable for surgery.  I have reviewed the patient's chart and labs.  Questions were answered to the patient's satisfaction.     [name] Jernee Murtaugh

## 2024-06-14 NOTE — H&P (Signed)
 H&P  Chief Complaint: Right sided kidney stone  History of Present Illness: Mary Hardy is a 43 y.o. year old female presenting for shockwave lithotripsy of a 10 mm symptomatic right upper ureteral stone.  She presented to [facility] urology on 06/07/2024, basically a week after she presented to the emergency room and was found with that stone.  Follow-up KUB revealed persistent stone, without distal passage.  I described treatment options including shockwave lithotripsy versus ureteroscopic management with holmium laser and stent.  She prefers shockwave lithotripsy.  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    Allergies:  Allergies  Allergen Reactions   Proanthocyanidin Anaphylaxis    Including grape juice, extracts, etc., reaction, throat gets scratchy, Pt gets short of breath, breaks out in rash also.    Family History  Problem Relation Age of Onset   Glaucoma Paternal Grandfather    Heart attack Paternal Grandfather    Glaucoma Paternal Grandmother    Heart attack Maternal Grandmother    Heart disease Maternal Grandmother    Diabetes Maternal Grandmother    Heart attack Maternal Grandfather    Heart disease Maternal Grandfather    Carpal tunnel syndrome Mother    Fibromyalgia Mother    Heart disease Mother    ADD / ADHD Daughter    Cancer Maternal Aunt    Heart attack Maternal Uncle     Social History:  reports that she quit smoking about 8 years ago. Her smoking use included e-cigarettes. She  has never used smokeless tobacco. She reports that she does not currently use alcohol. She reports that she does not use drugs.  ROS: A complete review of systems was performed.  All systems are negative except for pertinent findings as noted.  Physical Exam:  Vital signs in last 24 hours:   General:  Alert and oriented, No acute distress HEENT: Normocephalic, atraumatic Neck: No JVD or lymphadenopathy Cardiovascular: Regular rate  Lungs: Normal inspiratory/expiratory excursio Back: No CVA tenderness Extremities: No edema Neurologic: Grossly intact  I have reviewed prior pt note  I have reviewed urinalysis results  I have independently reviewed prior imaging-both CT and KUB   Impression/Assessment:  10 mm right proximal ureteral stone.  Hounsfield units 1200, skin to stone distance 13 cm  Plan:  Shockwave lithotripsy.  This is the first part of possible staged procedure.  Mary Hardy 06/14/2024, 9:53 AM  Mary Hardy. Mary Gunderson MD

## 2024-06-15 ENCOUNTER — Encounter (HOSPITAL_COMMUNITY): Payer: Self-pay | Admitting: Urology

## 2024-06-16 ENCOUNTER — Encounter (HOSPITAL_COMMUNITY): Payer: Self-pay | Admitting: Urology

## 2024-06-24 ENCOUNTER — Other Ambulatory Visit: Payer: Self-pay | Admitting: Urology

## 2024-06-24 DIAGNOSIS — N201 Calculus of ureter: Secondary | ICD-10-CM

## 2024-06-28 ENCOUNTER — Encounter: Payer: Self-pay | Admitting: Urology

## 2024-06-30 NOTE — Progress Notes (Deleted)
 Name: Mary Hardy DOB: 1981/10/08 MRN: [id number]  Diagnoses: 1) Post-operative state  HPI: Mary Hardy presents post-operatively.   She underwent right ESWL procedure on 06/14/2024 by Dr. Matilda for management of a 1 cm right proximal ureteral stone.  Postop course: KUB today: Awaiting radiology read; ***  Today: She reports ***  She {Actions; denies-reports:120008} flank pain or abdominal pain. She {Actions; denies-reports:120008} fevers, nausea, or vomiting.  She {Actions; denies-reports:120008} increased urinary urgency, frequency, nocturia, dysuria, gross hematuria, hesitancy, straining to void, or sensations of incomplete emptying.  Medications: Current Outpatient Medications  Medication Sig Dispense Refill   acetaminophen  (TYLENOL ) 500 MG tablet Take 1,000 mg by mouth every 6 (six) hours as needed.     acetaminophen  (TYLENOL ) 500 MG tablet Take 2 tablets (1,000 mg total) by mouth every 8 (eight) hours as needed for moderate pain (pain score 4-6). (Patient not taking: Reported on 06/07/2024) 30 tablet 0   etonogestrel  (NEXPLANON ) 68 MG IMPL implant 1 each by Subdermal route once.     naproxen  (NAPROSYN ) 375 MG tablet Take 1 tablet (375 mg total) by mouth 2 (two) times daily. 10 tablet 0   NIFEdipine  (ADALAT  CC) 30 MG 24 hr tablet Take 1 tablet (30 mg total) by mouth daily. 30 tablet 2   ondansetron  (ZOFRAN -ODT) 4 MG disintegrating tablet Take 1 tablet (4 mg total) by mouth every 8 (eight) hours as needed for nausea or vomiting. (Patient not taking: Reported on 06/07/2024) 20 tablet 0   ondansetron  (ZOFRAN -ODT) 4 MG disintegrating tablet Take 1 tablet (4 mg total) by mouth every 6 (six) hours as needed for nausea or vomiting. 15 tablet 0   oxyCODONE  (ROXICODONE ) 5 MG immediate release tablet Take 1 tablet (5 mg total) by mouth every 6 (six) hours as needed for severe pain (pain score 7-10). 10 tablet 0   sertraline (ZOLOFT) 50 MG tablet Take by mouth.     tamsulosin   (FLOMAX ) 0.4 MG CAPS capsule TAKE 1 CAPSULE(0.4 MG) BY MOUTH DAILY AFTER BREAKFAST 20 capsule 0   No current facility-administered medications for this visit.    Allergies: Allergies  Allergen Reactions   Proanthocyanidin Anaphylaxis    Including grape juice, extracts, etc., reaction, throat gets scratchy, Pt gets short of breath, breaks out in rash also.    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   Past Surgical History:  Procedure Laterality Date   EXTRACORPOREAL SHOCK WAVE LITHOTRIPSY Right 06/14/2024   Procedure: LITHOTRIPSY, ESWL;  Surgeon: Matilda Senior, MD;  Location: [facility];  Service: Urology;  Laterality: Right;   GALLBLADDER SURGERY     MOUTH SURGERY     dental   WISDOM TOOTH EXTRACTION     Family History  Problem Relation Age of Onset   Glaucoma Paternal Grandfather    Heart attack Paternal Grandfather    Glaucoma Paternal Grandmother    Heart attack Maternal Grandmother    Heart disease Maternal Grandmother    Diabetes Maternal Grandmother    Heart attack Maternal Grandfather    Heart disease Maternal Grandfather    Carpal tunnel syndrome Mother    Fibromyalgia Mother    Heart disease Mother    ADD / ADHD Daughter    Cancer Maternal Aunt  Heart attack Maternal Uncle    Social History   Socioeconomic History   Marital status: Married    Spouse name: Toribio   Number of children: 1   Years of education: Not on file   Highest education level: Not on file  Occupational History   Not on file  Tobacco Use   Smoking status: Former    Types: E-cigarettes    Quit date: 03/21/2016    Years since quitting: 8.2   Smokeless tobacco: Never  Vaping Use   Vaping status: Former  Substance and Sexual Activity   Alcohol use: Not Currently   Drug use:  Never   Sexual activity: Yes    Birth control/protection: None  Other Topics Concern   Not on file  Social History Narrative   Not on file   Social Drivers of Health   Financial Resource Strain: Low Risk  (02/01/2024)   Received from [facility]   Overall Financial Resource Strain (CARDIA)    Difficulty of Paying Living Expenses: Not hard at all  Food Insecurity: No Food Insecurity (02/01/2024)   [facility]   Hunger Vital Sign    Within the past 12 months, you worried that your food would run out before you got the money to buy more.: Never true    Within the past 12 months, the food you bought just didn't last and you didn't have money to get more.: Never true  Transportation Needs: No Transportation Needs (02/01/2024)   Received from [facility] - Transportation    Lack of Transportation (Medical): No    Lack of Transportation (Non-Medical): No  Physical Activity: Not on file  Stress: Not on file  Social Connections: Unknown (04/04/2022)   Received [facility]   Social Network    Social Network: Not on file  Intimate Partner Violence: Unknown (02/26/2022)   Received from [facility]   HITS    Physically Hurt: Not on file    Insult or Talk Down To: Not on file    Threaten Physical Harm: Not on file    Scream or Curse: Not on file    SUBJECTIVE  Review of Systems Constitutional: Patient denies any unintentional weight loss or change in strength lntegumentary: Patient denies any rashes or pruritus Cardiovascular: Patient denies chest pain or syncope Respiratory: Patient denies shortness of breath Gastrointestinal: ***Patient denies nausea, vomiting, constipation, or diarrhea ***As per HPI Musculoskeletal: Patient denies muscle cramps or weakness Neurologic: Patient denies convulsions or seizures Allergic/Immunologic: Patient denies recent allergic reaction(s) Hematologic/Lymphatic: Patient denies bleeding tendencies Endocrine:  Patient denies heat/cold intolerance  GU: As per HPI.  OBJECTIVE There were no vitals filed for this visit. There is no height or weight on file to calculate BMI.  Physical Examination Constitutional: No obvious distress; patient is non-toxic appearing  Cardiovascular: No visible lower extremity edema.  Respiratory: The patient does not have audible wheezing/stridor; respirations do not appear labored  Gastrointestinal: Abdomen non-distended Musculoskeletal: Normal ROM of UEs  Skin: No obvious rashes/open sores  Neurologic: CN 2-12 grossly intact Psychiatric: Answered questions appropriately with normal affect  Hematologic/Lymphatic/Immunologic: No obvious bruises or sites of spontaneous bleeding  UA: ***negative ***positive for *** leukocytes, *** blood, ***nitrites Urine microscopy: *** WBC/hpf, *** RBC/hpf, *** bacteria ***glucosuria (secondary to ***Jardiance ***Farxiga use) ***otherwise unremarkable  PVR: *** ml  ASSESSMENT No diagnosis found.  We reviewed the operative procedures and findings. She is doing ***well. Pre-operative symptoms are *** since the procedure. We reviewed  recent imaging results; ***awaiting radiology results, appears to have ***no acute findings.  ***For stone prevention: Advised adequate hydration and we discussed option to consider low oxalate diet given that calcium  oxalate is the most common type of stone. Handout provided about stone prevention diet.  ***For recurrent stone formers: We discussed option to proceed with 24 hour urinalysis ([facility]) for metabolic evaluation, which may help with targeted recommendations for dietary I medication therapies for stone prevention. Patient elected to ***proceed/ ***hold off.  Will plan to follow up in ***6 months / ***1 year with KUB ***RUS for stone surveillance or sooner if needed.  Patient verbalized understanding of and agreement with current plan. All questions were answered.  PLAN Advised the  following: ***Maintain adequate fluid intake. ***Low oxalate diet. No follow-ups on file.  *** Billing: 90 day global period for ESWL ***  No orders of the defined types were placed in this encounter.   It has been explained that the patient is to follow regularly with their PCP in addition to all other providers involved in their care and to follow instructions provided by these respective offices. Patient advised to contact urology clinic if any urologic-pertaining questions, concerns, new symptoms or problems arise in the interim period.  There are no Patient Instructions on file for this visit.  Electronically signed by:  Lauraine KYM Oz, MSN, FNP-C, CUNP 06/30/2024 5:07 PM

## 2024-07-01 ENCOUNTER — Encounter: Admitting: Urology

## 2024-07-01 DIAGNOSIS — N200 Calculus of kidney: Secondary | ICD-10-CM

## 2024-07-01 DIAGNOSIS — Z09 Encounter for follow-up examination after completed treatment for conditions other than malignant neoplasm: Secondary | ICD-10-CM

## 2024-07-12 ENCOUNTER — Telehealth: Payer: Self-pay

## 2024-07-12 NOTE — Telephone Encounter (Signed)
 Called pt and left message stating that we need to cancel her upcoming appt w/ MD McKenzie appt was scheduled w/ wrong provider pt was advised that once she completes her KUB that her images will be forwarded to MD Dahlstedt for review. Pt also advised to c/b if needed

## 2024-07-15 ENCOUNTER — Other Ambulatory Visit: Payer: Self-pay

## 2024-07-15 ENCOUNTER — Emergency Department (HOSPITAL_COMMUNITY)
Admission: EM | Admit: 2024-07-15 | Discharge: 2024-07-15 | Disposition: A | Discharge status: Home / Self Care | Attending: Emergency Medicine | Admitting: Emergency Medicine

## 2024-07-15 ENCOUNTER — Encounter (HOSPITAL_COMMUNITY): Payer: Self-pay

## 2024-07-15 ENCOUNTER — Emergency Department (HOSPITAL_COMMUNITY)

## 2024-07-15 ENCOUNTER — Encounter: Admitting: Urology

## 2024-07-15 DIAGNOSIS — Z87891 Personal history of nicotine dependence: Secondary | ICD-10-CM | POA: Insufficient documentation

## 2024-07-15 DIAGNOSIS — R109 Unspecified abdominal pain: Secondary | ICD-10-CM | POA: Diagnosis present

## 2024-07-15 DIAGNOSIS — I10 Essential (primary) hypertension: Secondary | ICD-10-CM | POA: Insufficient documentation

## 2024-07-15 DIAGNOSIS — N132 Hydronephrosis with renal and ureteral calculous obstruction: Secondary | ICD-10-CM | POA: Insufficient documentation

## 2024-07-15 DIAGNOSIS — N200 Calculus of kidney: Secondary | ICD-10-CM

## 2024-07-15 LAB — CBC
HCT: 40.5 % (ref 36.0–46.0)
Hemoglobin: 14 g/dL (ref 12.0–15.0)
MCH: 30 pg (ref 26.0–34.0)
MCHC: 34.6 g/dL (ref 30.0–36.0)
MCV: 86.7 fL (ref 80.0–100.0)
Platelets: 254 K/uL (ref 150–400)
RBC: 4.67 MIL/uL (ref 3.87–5.11)
RDW: 12.3 % (ref 11.5–15.5)
WBC: 11.2 K/uL — ABNORMAL HIGH (ref 4.0–10.5)
nRBC: 0 % (ref 0.0–0.2)

## 2024-07-15 LAB — HCG, SERUM, QUALITATIVE: Preg, Serum: NEGATIVE

## 2024-07-15 LAB — URINALYSIS, ROUTINE W REFLEX MICROSCOPIC
Bilirubin Urine: NEGATIVE
Glucose, UA: NEGATIVE mg/dL
Ketones, ur: NEGATIVE mg/dL
Nitrite: NEGATIVE
Protein, ur: 30 mg/dL — AB
RBC / HPF: >50 RBC/hpf (ref 0–5)
Specific Gravity, Urine: 1.034 — ABNORMAL HIGH (ref 1.005–1.030)
pH: 5 (ref 5.0–8.0)

## 2024-07-15 LAB — COMPREHENSIVE METABOLIC PANEL WITH GFR
ALT: 33 U/L (ref 0–44)
AST: 24 U/L (ref 15–41)
Albumin: 4.1 g/dL (ref 3.5–5.0)
Alkaline Phosphatase: 64 U/L (ref 38–126)
Anion gap: 14 (ref 5–15)
BUN: 16 mg/dL (ref 6–20)
CO2: 20 mmol/L — ABNORMAL LOW (ref 22–32)
Calcium: 9.1 mg/dL (ref 8.9–10.3)
Chloride: 102 mmol/L (ref 98–111)
Creatinine, Ser: 0.97 mg/dL (ref 0.44–1.00)
GFR, Estimated: >60 mL/min (ref >60)
Glucose, Bld: 168 mg/dL — ABNORMAL HIGH (ref 70–99)
Potassium: 3.6 mmol/L (ref 3.5–5.1)
Sodium: 136 mmol/L (ref 135–145)
Total Bilirubin: 0.8 mg/dL (ref 0.0–1.2)
Total Protein: 7.3 g/dL (ref 6.5–8.1)

## 2024-07-15 MED ORDER — ONDANSETRON HCL 4 MG/2ML IJ SOLN
4.0000 mg | Freq: Once | INTRAMUSCULAR | Status: AC
  Administered 2024-07-15: 4 mg via INTRAVENOUS
  Filled 2024-07-15: qty 2

## 2024-07-15 MED ORDER — KETOROLAC TROMETHAMINE 15 MG/ML IJ SOLN
15.0000 mg | Freq: Once | INTRAMUSCULAR | Status: AC
  Administered 2024-07-15: 15 mg via INTRAVENOUS
  Filled 2024-07-15: qty 1

## 2024-07-15 MED ORDER — SODIUM CHLORIDE 0.9 % IV BOLUS
1000.0000 mL | Freq: Once | INTRAVENOUS | Status: AC
  Administered 2024-07-15: 1000 mL via INTRAVENOUS

## 2024-07-15 MED ORDER — OXYCODONE HCL 5 MG PO TABS: 5.0000 mg | ORAL_TABLET | ORAL | 0 refills | Status: AC | PRN

## 2024-07-15 MED ORDER — HYDROMORPHONE HCL 1 MG/ML IJ SOLN
1.0000 mg | Freq: Once | INTRAMUSCULAR | Status: AC
  Administered 2024-07-15: 1 mg via INTRAVENOUS
  Filled 2024-07-15: qty 1

## 2024-07-15 MED ORDER — TAMSULOSIN HCL 0.4 MG PO CAPS: 0.4000 mg | ORAL_CAPSULE | Freq: Every day | ORAL | 0 refills | Status: AC

## 2024-07-15 NOTE — ED Triage Notes (Signed)
 Pov from home. Cc of right flank pain for a couple weeks, had lithotripsy for kidney stones. Says the last 6 hours the pain has been unbearable. Says there is blood in her urine. 10/10

## 2024-07-15 NOTE — ED Notes (Signed)
 Patient transported to CT

## 2024-07-15 NOTE — Discharge Instructions (Addendum)
 You were evaluated in the Emergency Department and after careful evaluation, we did not find any emergent condition requiring admission or further testing in the hospital.  Your exam/testing today is overall reassuring.  Symptoms likely due to a kidney stone.  Take the Flomax  daily to help you pass the stone.  Recommend Tylenol  1000 mg every 4-6 hours and/or Motrin  600 mg every 4-6 hours for pain.  You can use the oxycodone  medication for more significant pain.  Follow-up with urology.  Please return to the Emergency Department if you experience any worsening of your condition.   Thank you for allowing us  to be a part of your care.

## 2024-07-15 NOTE — ED Provider Notes (Signed)
 [facility] Emergency Department Provider Note MRN:  [id number]  Arrival date & time: 07/15/24     Chief Complaint   Flank Pain   History of Present Illness   Mary Hardy is a 43 y.o. year-old female with a history of kidney stones presenting to the ED with chief complaint of flank pain.  Right flank pain worsening this early morning.  Recent lithotripsy, pain is severe.  Associated with nausea.  Review of Systems  A thorough review of systems was obtained and all systems are negative except as noted in the HPI and PMH.   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    Past Surgical History:  Procedure Laterality Date   EXTRACORPOREAL SHOCK WAVE LITHOTRIPSY Right 06/14/2024   Procedure: LITHOTRIPSY, ESWL;  Surgeon: Matilda Senior, MD;  Location: [facility];  Service: Urology;  Laterality: Right;   GALLBLADDER SURGERY     MOUTH SURGERY     dental   WISDOM TOOTH EXTRACTION      Family History  Problem Relation Age of Onset   Glaucoma Paternal Grandfather    Heart attack Paternal Grandfather    Glaucoma Paternal Grandmother    Heart attack Maternal Grandmother    Heart disease Maternal Grandmother    Diabetes Maternal Grandmother    Heart attack Maternal Grandfather    Heart disease Maternal Grandfather    Carpal tunnel syndrome Mother    Fibromyalgia Mother    Heart disease Mother    ADD / ADHD Daughter    Cancer Maternal Aunt    Heart attack Maternal Uncle     Social History   Socioeconomic History   Marital status: Married    Spouse name: Toribio   Number of children: 1   Years of education: Not on file   Highest education level: Not on file  Occupational History   Not on file  Tobacco Use   Smoking  status: Former    Types: E-cigarettes    Quit date: 03/21/2016    Years since quitting: 8.3   Smokeless tobacco: Never  Vaping Use   Vaping status: Former  Substance and Sexual Activity   Alcohol use: Not Currently   Drug use: Never   Sexual activity: Yes    Birth control/protection: None  Other Topics Concern   Not on file  Social History Narrative   Not on file   Social Drivers of Health   Financial Resource Strain: Low Risk  (02/01/2024)   Received from [facility]   Overall Financial Resource Strain (CARDIA)    Difficulty of Paying Living Expenses: Not hard at all  Food Insecurity: No Food Insecurity (02/01/2024)   Received from [facility]   Hunger Vital Sign    Within the past 12 months, you worried that your food would run out before you got the money to buy more.: Never true    Within the past 12 months, the food you bought just didn't last and you didn't have money to get more.: Never true  Transportation Needs: No Transportation Needs (02/01/2024)   Received from [facility] - Transportation    Lack of Transportation (Medical): No    Lack  of Transportation (Non-Medical): No  Physical Activity: Not on file  Stress: Not on file  Social Connections: Unknown (04/04/2022)   Received from [facility]   Social Network    Social Network: Not on file  Intimate Partner Violence: Unknown (02/26/2022)   Received from [facility]   HITS    Physically Hurt: Not on file    Insult or Talk Down To: Not on file    Threaten Physical Harm: Not on file    Scream or Curse: Not on file     Physical Exam   Vitals:   07/15/24 0615 07/15/24 0700  BP: 130/88 (!) 134/93  Pulse: 76 67  Resp:  18  Temp:    SpO2: 94% 95%    CONSTITUTIONAL: Well-appearing, moderate distress due to pain NEURO/PSYCH:  Alert and oriented x 3, no focal deficits EYES:  eyes equal and reactive ENT/NECK:  no LAD, no JVD CARDIO: Regular rate, well-perfused, normal S1 and S2 PULM:  CTAB  no wheezing or rhonchi GI/GU:  non-distended, non-tender MSK/SPINE:  No gross deformities, no edema SKIN:  no rash, atraumatic   *Additional and/or pertinent findings included in MDM below  Diagnostic and Interventional Summary    EKG Interpretation Date/Time:    Ventricular Rate:    PR Interval:    QRS Duration:    QT Interval:    QTC Calculation:   R Axis:      Text Interpretation:         Labs Reviewed  CBC - Abnormal; Notable for the following components:      Result Value   WBC 11.2 (*)    All other components within normal limits  COMPREHENSIVE METABOLIC PANEL WITH GFR - Abnormal; Notable for the following components:   CO2 20 (*)    Glucose, Bld 168 (*)    All other components within normal limits  URINALYSIS, ROUTINE W REFLEX MICROSCOPIC - Abnormal; Notable for the following components:   APPearance CLOUDY (*)    Specific Gravity, Urine 1.034 (*)    Hgb urine dipstick LARGE (*)    Protein, ur 30 (*)    Leukocytes,Ua MODERATE (*)    Bacteria, UA RARE (*)    All other components within normal limits  HCG, SERUM, QUALITATIVE    CT RENAL STONE STUDY  Final Result      Medications  HYDROmorphone  (DILAUDID ) injection 1 mg (1 mg Intravenous Given 07/15/24 0523)  sodium chloride  0.9 % bolus 1,000 mL (0 mLs Intravenous Stopped 07/15/24 0610)  ondansetron  (ZOFRAN ) injection 4 mg (4 mg Intravenous Given 07/15/24 0522)  ketorolac  (TORADOL ) 15 MG/ML injection 15 mg (15 mg Intravenous Given 07/15/24 0529)     Procedures  /  Critical Care Procedures  ED Course and Medical Decision Making  Initial Impression and Ddx Highly suspect kidney stone, awaiting labs, CT.  Other considerations include aortic pathology, diverticulitis, pyonephritis, MSK.  Past medical/surgical history that increases complexity of ED encounter: Kidney stones  Interpretation of Diagnostics I personally reviewed the Laboratory Testing and my interpretation is as follows: No significant blood  count or electrolyte disturbance.  Suspect urine contaminated, doubt infection.  Patient Reassessment and Ultimate Disposition/Management     Feeling much better on reassessment, CT confirms UVJ stone on the right.  Appropriate for discharge with urology follow-up.  Patient management required discussion with the following services or consulting groups:  None  Complexity of Problems Addressed Acute illness or injury that poses threat of life of bodily function  Additional Data  Reviewed and Analyzed Further history obtained from: Further history from spouse/family member  Additional Factors Impacting ED Encounter Risk Prescriptions, Use of parenteral controlled substances, and Consideration of hospitalization  Ozell HERO. Theadore, MD [facility] Emergency Medicine [facility] [email or web address]  Final Clinical Impressions(s) / ED Diagnoses     ICD-10-CM   1. Kidney stone  N20.0       ED Discharge Orders          Ordered    tamsulosin  (FLOMAX ) 0.4 MG CAPS capsule  Daily        07/15/24 0725    oxyCODONE  (ROXICODONE ) 5 MG immediate release tablet  Every 4 hours PRN        07/15/24 0725             Discharge Instructions Discussed with and Provided to Patient:     Discharge Instructions      You were evaluated in the Emergency Department and after careful evaluation, we did not find any emergent condition requiring admission or further testing in the hospital.  Your exam/testing today is overall reassuring.  Symptoms likely due to a kidney stone.  Take the Flomax  daily to help you pass the stone.  Recommend Tylenol  1000 mg every 4-6 hours and/or Motrin  600 mg every 4-6 hours for pain.  You can use the oxycodone  medication for more significant pain.  Follow-up with urology.  Please return to the Emergency Department if you experience any worsening of your condition.   Thank you for allowing us  to be a part of your care.       Theadore Ozell HERO,  MD 07/15/24 0730

## 2024-07-25 ENCOUNTER — Emergency Department (HOSPITAL_COMMUNITY)
Admission: EM | Admit: 2024-07-25 | Discharge: 2024-07-26 | Disposition: A | Discharge status: Home / Self Care | Attending: Emergency Medicine | Admitting: Emergency Medicine

## 2024-07-25 ENCOUNTER — Encounter (HOSPITAL_COMMUNITY): Payer: Self-pay

## 2024-07-25 ENCOUNTER — Other Ambulatory Visit: Payer: Self-pay

## 2024-07-25 ENCOUNTER — Emergency Department (HOSPITAL_COMMUNITY)

## 2024-07-25 DIAGNOSIS — R42 Dizziness and giddiness: Secondary | ICD-10-CM | POA: Insufficient documentation

## 2024-07-25 DIAGNOSIS — I10 Essential (primary) hypertension: Secondary | ICD-10-CM | POA: Insufficient documentation

## 2024-07-25 DIAGNOSIS — Z87891 Personal history of nicotine dependence: Secondary | ICD-10-CM | POA: Insufficient documentation

## 2024-07-25 NOTE — ED Triage Notes (Signed)
 Pt arrived from home via POV c/o new onset HTN along with vertigo since Thursday. Pt states that she first though it was a reaction to chemicals, but it has not gone away. NIH in triage 0

## 2024-07-26 LAB — BASIC METABOLIC PANEL WITH GFR
Anion gap: 10 (ref 5–15)
BUN: <5 mg/dL — ABNORMAL LOW (ref 6–20)
CO2: 23 mmol/L (ref 22–32)
Calcium: 9.3 mg/dL (ref 8.9–10.3)
Chloride: 106 mmol/L (ref 98–111)
Creatinine, Ser: 0.88 mg/dL (ref 0.44–1.00)
GFR, Estimated: >60 mL/min (ref >60)
Glucose, Bld: 137 mg/dL — ABNORMAL HIGH (ref 70–99)
Potassium: 3.7 mmol/L (ref 3.5–5.1)
Sodium: 139 mmol/L (ref 135–145)

## 2024-07-26 LAB — CBC
HCT: 46.4 % — ABNORMAL HIGH (ref 36.0–46.0)
Hemoglobin: 15.6 g/dL — ABNORMAL HIGH (ref 12.0–15.0)
MCH: 29.2 pg (ref 26.0–34.0)
MCHC: 33.6 g/dL (ref 30.0–36.0)
MCV: 86.9 fL (ref 80.0–100.0)
Platelets: 302 K/uL (ref 150–400)
RBC: 5.34 MIL/uL — ABNORMAL HIGH (ref 3.87–5.11)
RDW: 12.1 % (ref 11.5–15.5)
WBC: 9.7 K/uL (ref 4.0–10.5)
nRBC: 0 % (ref 0.0–0.2)

## 2024-07-26 LAB — TROPONIN I (HIGH SENSITIVITY): Troponin I (High Sensitivity): <2 ng/L (ref <18)

## 2024-07-26 LAB — HCG, SERUM, QUALITATIVE: Preg, Serum: NEGATIVE

## 2024-07-26 MED ORDER — PROCHLORPERAZINE EDISYLATE 10 MG/2ML IJ SOLN
10.0000 mg | Freq: Once | INTRAMUSCULAR | Status: AC
  Administered 2024-07-26: 10 mg via INTRAVENOUS
  Filled 2024-07-26: qty 2

## 2024-07-26 MED ORDER — DIPHENHYDRAMINE HCL 50 MG/ML IJ SOLN
25.0000 mg | Freq: Once | INTRAMUSCULAR | Status: AC
  Administered 2024-07-26: 25 mg via INTRAVENOUS
  Filled 2024-07-26: qty 1

## 2024-07-26 MED ORDER — MECLIZINE HCL 25 MG PO TABS: 25.0000 mg | ORAL_TABLET | Freq: Three times a day (TID) | ORAL | 0 refills | Status: AC | PRN

## 2024-07-26 MED ORDER — MECLIZINE HCL 25 MG PO TABS
50.0000 mg | ORAL_TABLET | Freq: Once | ORAL | Status: AC
  Administered 2024-07-26: 50 mg via ORAL
  Filled 2024-07-26: qty 2

## 2024-07-26 MED ORDER — SODIUM CHLORIDE 0.9 % IV BOLUS
1000.0000 mL | Freq: Once | INTRAVENOUS | Status: AC
  Administered 2024-07-26: 1000 mL via INTRAVENOUS

## 2024-07-26 MED ORDER — DIPHENHYDRAMINE HCL 50 MG/ML IJ SOLN
50.0000 mg | Freq: Once | INTRAMUSCULAR | Status: AC
  Administered 2024-07-26: 50 mg via INTRAVENOUS
  Filled 2024-07-26: qty 1

## 2024-07-26 NOTE — Discharge Instructions (Signed)
 You were evaluated in the Emergency Department and after careful evaluation, we did not find any emergent condition requiring admission or further testing in the hospital.  Your exam/testing today is overall reassuring.  Symptoms likely due to peripheral vertigo.  Try the Epley maneuver at home and use the meclizine  as needed.  Please return to the Emergency Department if you experience any worsening of your condition.   Thank you for allowing us  to be a part of your care.

## 2024-07-26 NOTE — ED Provider Notes (Signed)
 [facility] Emergency Department Provider Note MRN:  [id number]  Arrival date & time: 07/26/24     Chief Complaint   Hypertension and Dizziness   History of Present Illness   Mary Hardy is a 43 y.o. year-old female with a history of hypertension presenting to the ED with chief complaint of dizziness.  Persistently dizzy over the past few days.  Described as room spinning, only happens with change in head position.  Denies numbness or weakness to the arms or legs, no headache, has noted that her blood pressure is high lately.  Review of Systems  A thorough review of systems was obtained and all systems are negative except as noted in the HPI and PMH.   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    Past Surgical History:  Procedure Laterality Date   EXTRACORPOREAL SHOCK WAVE LITHOTRIPSY Right 06/14/2024   Procedure: LITHOTRIPSY, ESWL;  Surgeon: Matilda Senior, MD;  Location: [facility];  Service: Urology;  Laterality: Right;   GALLBLADDER SURGERY     MOUTH SURGERY     dental   WISDOM TOOTH EXTRACTION      Family History  Problem Relation Age of Onset   Glaucoma Paternal Grandfather    Heart attack Paternal Grandfather    Glaucoma Paternal Grandmother    Heart attack Maternal Grandmother    Heart disease Maternal Grandmother    Diabetes Maternal Grandmother    Heart attack Maternal Grandfather    Heart disease Maternal Grandfather    Carpal tunnel syndrome Mother    Fibromyalgia Mother    Heart disease Mother    ADD / ADHD Daughter    Cancer Maternal Aunt    Heart attack Maternal Uncle     Social History   Socioeconomic History   Marital status: Married    Spouse name: Toribio   Number of children: 1   Years of  education: Not on file   Highest education level: Not on file  Occupational History   Not on file  Tobacco Use   Smoking status: Former    Types: E-cigarettes    Quit date: 03/21/2016    Years since quitting: 8.3   Smokeless tobacco: Never  Vaping Use   Vaping status: Former  Substance and Sexual Activity   Alcohol use: Not Currently   Drug use: Never   Sexual activity: Yes    Birth control/protection: None  Other Topics Concern   Not on file  Social History Narrative   Not on file   Social Drivers of Health   Financial Resource Strain: Low Risk  (02/01/2024)   Received from [facility]   Overall Financial Resource Strain (CARDIA)    Difficulty of Paying Living Expenses: Not hard at all  Food Insecurity: No Food Insecurity (02/01/2024)   Received from [facility]   Hunger Vital Sign    Within the past 12 months, you worried that your food would run out before you got the money to buy more.: Never true    Within the past 12 months, the food you bought just didn't last and you didn't have money to get more.: Never true  Transportation Needs: No Transportation Needs (02/01/2024)  Received from [facility]   PRAPARE - Transportation    Lack of Transportation (Medical): No    Lack of Transportation (Non-Medical): No  Physical Activity: Not on file  Stress: Not on file  Social Connections: Unknown (04/04/2022)   Received from [facility]   Social Network    Social Network: Not on file  Intimate Partner Violence: Unknown (02/26/2022)   Received from [facility]   HITS    Physically Hurt: Not on file    Insult or Talk Down To: Not on file    Threaten Physical Harm: Not on file    Scream or Curse: Not on file     Physical Exam   Vitals:   07/25/24 2346 07/26/24 0200  BP:  (!) 158/104  Pulse:  99  Resp:  18  Temp:  98 F (36.7 C)  SpO2: 99% 100%    CONSTITUTIONAL: Well-appearing, NAD NEURO/PSYCH:  Alert and oriented x 3, normal and symmetric strength and  sensation, normal coordination, normal speech EYES:  eyes equal and reactive ENT/NECK:  no LAD, no JVD CARDIO: Regular rate, well-perfused, normal S1 and S2 PULM:  CTAB no wheezing or rhonchi GI/GU:  non-distended, non-tender MSK/SPINE:  No gross deformities, no edema SKIN:  no rash, atraumatic   *Additional and/or pertinent findings included in MDM below  Diagnostic and Interventional Summary    EKG Interpretation Date/Time:    Ventricular Rate:    PR Interval:    QRS Duration:    QT Interval:    QTC Calculation:   R Axis:      Text Interpretation:         Labs Reviewed  BASIC METABOLIC PANEL WITH GFR - Abnormal; Notable for the following components:      Result Value   Glucose, Bld 137 (*)    BUN <5 (*)    All other components within normal limits  CBC - Abnormal; Notable for the following components:   RBC 5.34 (*)    Hemoglobin 15.6 (*)    HCT 46.4 (*)    All other components within normal limits  HCG, SERUM, QUALITATIVE  TROPONIN I (HIGH SENSITIVITY)    DG Chest 2 View  Final Result      Medications  meclizine  (ANTIVERT ) tablet 50 mg (50 mg Oral Given 07/26/24 0117)  sodium chloride  0.9 % bolus 1,000 mL (1,000 mLs Intravenous New Bag/Given 07/26/24 0116)  diphenhydrAMINE  (BENADRYL ) injection 25 mg (25 mg Intravenous Given 07/26/24 0117)  prochlorperazine  (COMPAZINE ) injection 10 mg (10 mg Intravenous Given 07/26/24 0117)  diphenhydrAMINE  (BENADRYL ) injection 50 mg (50 mg Intravenous Given 07/26/24 0144)     Procedures  /  Critical Care Procedures  ED Course and Medical Decision Making  Initial Impression and Ddx Vertigo consistent with B PPV.  No other symptoms or risk factors to suggest central vertigo.  Providing symptomatic management and will reassess.  Past medical/surgical history that increases complexity of ED encounter: Hypertension  Interpretation of Diagnostics I personally reviewed the Laboratory Testing and my interpretation is as follows: No  significant blood count or electrolyte disturbance.  Troponin negative  Patient Reassessment and Ultimate Disposition/Management     Patient feeling better, reassuring workup, no indication for MRI or further testing.  Appropriate for discharge.  Patient management required discussion with the following services or consulting groups:  None  Complexity of Problems Addressed Acute illness or injury that poses threat of life of bodily function  Additional Data Reviewed and Analyzed Further history obtained from: Further  history from spouse/family member  Additional Factors Impacting ED Encounter Risk Prescriptions  Ozell HERO. Theadore, MD [facility] Emergency Medicine [facility] [email or web address]  Final Clinical Impressions(s) / ED Diagnoses     ICD-10-CM   1. Vertigo  R42       ED Discharge Orders          Ordered    meclizine  (ANTIVERT ) 25 MG tablet  3 times daily PRN        07/26/24 0236             Discharge Instructions Discussed with and Provided to Patient:     Discharge Instructions      You were evaluated in the Emergency Department and after careful evaluation, we did not find any emergent condition requiring admission or further testing in the hospital.  Your exam/testing today is overall reassuring.  Symptoms likely due to peripheral vertigo.  Try the Epley maneuver at home and use the meclizine  as needed.  Please return to the Emergency Department if you experience any worsening of your condition.   Thank you for allowing us  to be a part of your care.       Theadore Ozell HERO, MD 07/26/24 [phone]
